# Patient Record
Sex: Male | Born: 1970 | Race: Black or African American | Hispanic: No | Smoking: Light tobacco smoker
Health system: Southern US, Community
[De-identification: ages and names within clinical notes are randomized; demographics above are authoritative.]

## PROBLEM LIST (undated history)

## (undated) ENCOUNTER — Ambulatory Visit: Admission: EM | Payer: 59 | Source: Home / Self Care

## (undated) DIAGNOSIS — E7800 Pure hypercholesterolemia, unspecified: Secondary | ICD-10-CM

---

## 2001-12-17 ENCOUNTER — Emergency Department (HOSPITAL_COMMUNITY): Admission: EM | Admit: 2001-12-17 | Discharge: 2001-12-17 | Payer: Self-pay | Admitting: Emergency Medicine

## 2006-08-31 ENCOUNTER — Emergency Department (HOSPITAL_COMMUNITY): Admission: EM | Admit: 2006-08-31 | Discharge: 2006-08-31 | Payer: Self-pay | Admitting: Emergency Medicine

## 2009-04-21 ENCOUNTER — Ambulatory Visit (HOSPITAL_BASED_OUTPATIENT_CLINIC_OR_DEPARTMENT_OTHER): Admission: RE | Admit: 2009-04-21 | Discharge: 2009-04-21 | Payer: Self-pay | Admitting: Family Medicine

## 2009-04-25 ENCOUNTER — Ambulatory Visit: Payer: Self-pay | Admitting: Internal Medicine

## 2009-10-06 ENCOUNTER — Encounter (INDEPENDENT_AMBULATORY_CARE_PROVIDER_SITE_OTHER): Payer: Self-pay | Admitting: Emergency Medicine

## 2009-10-06 ENCOUNTER — Observation Stay (HOSPITAL_COMMUNITY): Admission: EM | Admit: 2009-10-06 | Discharge: 2009-10-06 | Payer: Self-pay | Admitting: Emergency Medicine

## 2011-01-17 LAB — DIFFERENTIAL
Eosinophils Absolute: 0.2 10*3/uL (ref 0.0–0.7)
Eosinophils Relative: 3 % (ref 0–5)
Lymphs Abs: 3.1 10*3/uL (ref 0.7–4.0)
Monocytes Relative: 7 % (ref 3–12)

## 2011-01-17 LAB — LIPASE, BLOOD: Lipase: 25 U/L (ref 11–59)

## 2011-01-17 LAB — BASIC METABOLIC PANEL
BUN: 17 mg/dL (ref 6–23)
CO2: 28 mEq/L (ref 19–32)
Chloride: 101 mEq/L (ref 96–112)
Creatinine, Ser: 1.08 mg/dL (ref 0.4–1.5)

## 2011-01-17 LAB — HEPATIC FUNCTION PANEL
Alkaline Phosphatase: 44 U/L (ref 39–117)
Bilirubin, Direct: <0.1 mg/dL (ref 0.0–0.3)
Total Bilirubin: 0.6 mg/dL (ref 0.3–1.2)
Total Protein: 7.8 g/dL (ref 6.0–8.3)

## 2011-01-17 LAB — CBC
HCT: 46 % (ref 39.0–52.0)
MCV: 98.9 fL (ref 78.0–100.0)
Platelets: 143 10*3/uL — ABNORMAL LOW (ref 150–400)
WBC: 8.4 10*3/uL (ref 4.0–10.5)

## 2011-01-17 LAB — POCT CARDIAC MARKERS: Troponin i, poc: <0.05 ng/mL (ref 0.00–0.09)

## 2011-03-01 NOTE — Procedures (Signed)
Harry Gonzales, Harry Gonzales                   ACCOUNT NO.:  [id number]   MEDICAL RECORD NO.:  [id number]          PATIENT TYPE:  OUT   LOCATION:  SLEEP CENTER                 FACILITY:  [facility]   PHYSICIAN:  Clinton D. Maple Hudson, MD, FCCP, FACPDATE OF BIRTH:  1972-04-01   DATE OF STUDY:  04/21/2009                            NOCTURNAL POLYSOMNOGRAM   REFERRING PHYSICIAN:  Caryn Bee L. Little, M.D.   INDICATION FOR STUDY:  Hypersomnia with sleep apnea.  Epworth sleepiness  score 11/24, BMI 30, weight 209 pounds, height 70 inches, neck 17.5  inches.  Home medication charted and reviewed.   SLEEP ARCHITECTURE:  Total sleep time 344 minutes with sleep efficiency  93.9%.  Stage I 5.7%, stage II 76.5%, stage III absent, REM 17.9% of  total sleep time.  Sleep latency 1.5 minutes, REM latency 57.5 minutes,  awake after sleep onset 21 minutes, arousal index 19.  Bedtime  medication taken prior to arrival..   RESPIRATORY DATA:  Apnea/hypopnea index (AHI) 15.7 per hour.  A total of  90 events were scored including 71 obstructive apneas, 4 central apneas  and 15 hypopneas.  Events were seen in all sleep positions, but more  common while supine.  REM AHI 36.1 per hour.  An additional 8  respiratory effort related arousals were counted.  There were  insufficient early events to permit CPAP titration by split protocol on  the study night.   OXYGEN DATA:  Moderate to occasionally loud snoring with oxygen  desaturation to a nadir of 87%.  Mean oxygen saturation through the  study was 95.6% on room air.   CARDIAC DATA:  Normal sinus rhythm.   MOVEMENT/PARASOMNIA:  No significant movement disturbance.  Bathroom x1.   IMPRESSION/RECOMMENDATIONS:  1. Moderate obstructive sleep apnea/hypopnea syndrome, AHI 15.7 per      hour with events seen in all sleep positions.  REM AHI 36.1.      Moderate to loud snoring with oxygen desaturation to a nadir of      87%.  2. There were insufficient early events to permit CPAP  titration by      split study protocol on this study night.  If CPAP is considered a      treatment option, consider return for CPAP titration or evaluate      for alternative management as appropriate.      Clinton D. Maple Hudson, MD, [facility], FACP  Diplomate, Biomedical engineer of Sleep Medicine  Electronically Signed    CDY/MEDQ  D:  04/25/2009 11:47:26  T:  04/25/2009 23:18:50  Job:  [id number]

## 2011-08-27 ENCOUNTER — Encounter: Payer: Self-pay | Admitting: *Deleted

## 2011-08-27 ENCOUNTER — Emergency Department (HOSPITAL_COMMUNITY)
Admission: EM | Admit: 2011-08-27 | Discharge: 2011-08-27 | Disposition: A | Discharge status: Home / Self Care | Payer: Self-pay | Attending: Emergency Medicine | Admitting: Emergency Medicine

## 2011-08-27 DIAGNOSIS — J069 Acute upper respiratory infection, unspecified: Secondary | ICD-10-CM | POA: Insufficient documentation

## 2011-08-27 DIAGNOSIS — F17200 Nicotine dependence, unspecified, uncomplicated: Secondary | ICD-10-CM | POA: Insufficient documentation

## 2011-08-27 MED ORDER — IBUPROFEN 800 MG PO TABS
800.0000 mg | ORAL_TABLET | Freq: Once | ORAL | Status: DC
  Filled 2011-08-27: qty 1

## 2011-08-27 MED ORDER — IBUPROFEN 800 MG PO TABS: 800.0000 mg | ORAL_TABLET | Freq: Three times a day (TID) | ORAL | Status: AC

## 2011-08-27 NOTE — ED Notes (Signed)
Generalized body aches for several days, congestion and cough

## 2011-08-27 NOTE — ED Provider Notes (Signed)
History     CSN: [id number] Arrival date & time: 08/27/2011  5:09 PM   First MD Initiated Contact with Patient 08/27/11 1849      Chief Complaint  Patient presents with  . Generalized Body Aches    (Consider location/radiation/quality/duration/timing/severity/associated sxs/prior treatment) HPI  Patient presents to emergency department complaining of gradual onset 2 day history of nasal congestion mild cough but today states gradual onset total body aches which is his chief complaint. Patient denies known fevers. Patient has not taken any medication prior to arrival. Patient denies headache, stiff neck, chest pain, shortness of breath, wheezing, abdominal pain, nausea, vomiting, diarrhea. Patient states multiple coworkers have been sick with similar symptoms. Denies aggravating or alleviating factors. Symptoms were gradual onset, persistent, and unchanging.  History reviewed. No pertinent past medical history.  History reviewed. No pertinent past surgical history.  No family history on file.  History  Substance Use Topics  . Smoking status: Current Everyday Smoker  . Smokeless tobacco: Not on file  . Alcohol Use: Yes      Review of Systems  All other systems reviewed and are negative.    Allergies  Review of patient's allergies indicates not on file.  Home Medications  No current outpatient prescriptions on file.  BP 142/88  Pulse 84  Temp(Src) 98.7 F (37.1 C) (Oral)  Resp 20  SpO2 100%  Physical Exam  Vitals reviewed. Constitutional: He is oriented to person, place, and time. He appears well-developed and well-nourished. No distress.  HENT:  Head: Normocephalic and atraumatic.  Right Ear: External ear normal.  Left Ear: External ear normal.  Nose: Nose normal.  Mouth/Throat: No oropharyngeal exudate.       Mild erythema of posterior pharynx and tonsils no tonsillar exudate or enlargement. Patent airway. Swallowing secretions well  Eyes: Conjunctivae and  EOM are normal. Pupils are equal, round, and reactive to light.  Neck: Normal range of motion. Neck supple.  Cardiovascular: Normal rate, regular rhythm and normal heart sounds.  Exam reveals no gallop and no friction rub.   No murmur heard. Pulmonary/Chest: Effort normal and breath sounds normal. No respiratory distress. He has no wheezes. He has no rales. He exhibits no tenderness.  Abdominal: Soft. He exhibits no distension and no mass. There is no tenderness. There is no rebound and no guarding.  Lymphadenopathy:    He has no cervical adenopathy.  Neurological: He is alert and oriented to person, place, and time. He has normal reflexes.  Skin: Skin is warm and dry. No rash noted. He is not diaphoretic.  Psychiatric: He has a normal mood and affect.    ED Course  Procedures (including critical care time)  By mouth ibuprofen  Labs Reviewed - No data to display No results found.   1. Upper respiratory infection       MDM  Patient with upper respiratory type symptoms with normal lung exam and afebrile. Patient is nontoxic-appearing. Question viral illness.        Jenness Corner, [place] 08/28/11 [phone]

## 2011-08-28 NOTE — ED Provider Notes (Signed)
Medical screening examination/treatment/procedure(s) were performed by non-physician practitioner and as supervising physician I was immediately available for consultation/collaboration.  Hurman Horn, MD 08/28/11 1245

## 2012-02-07 ENCOUNTER — Emergency Department (HOSPITAL_COMMUNITY): Payer: Self-pay

## 2012-02-07 ENCOUNTER — Observation Stay (HOSPITAL_COMMUNITY)
Admission: EM | Admit: 2012-02-07 | Discharge: 2012-02-08 | Disposition: A | Discharge status: Home / Self Care | Payer: Self-pay | Attending: Emergency Medicine | Admitting: Emergency Medicine

## 2012-02-07 ENCOUNTER — Other Ambulatory Visit: Payer: Self-pay

## 2012-02-07 ENCOUNTER — Encounter (HOSPITAL_COMMUNITY): Payer: Self-pay | Admitting: *Deleted

## 2012-02-07 DIAGNOSIS — R61 Generalized hyperhidrosis: Secondary | ICD-10-CM | POA: Insufficient documentation

## 2012-02-07 DIAGNOSIS — E7800 Pure hypercholesterolemia, unspecified: Secondary | ICD-10-CM | POA: Insufficient documentation

## 2012-02-07 DIAGNOSIS — R079 Chest pain, unspecified: Principal | ICD-10-CM | POA: Insufficient documentation

## 2012-02-07 DIAGNOSIS — R209 Unspecified disturbances of skin sensation: Secondary | ICD-10-CM | POA: Insufficient documentation

## 2012-02-07 DIAGNOSIS — F17200 Nicotine dependence, unspecified, uncomplicated: Secondary | ICD-10-CM | POA: Insufficient documentation

## 2012-02-07 DIAGNOSIS — R42 Dizziness and giddiness: Secondary | ICD-10-CM | POA: Insufficient documentation

## 2012-02-07 DIAGNOSIS — Z8249 Family history of ischemic heart disease and other diseases of the circulatory system: Secondary | ICD-10-CM | POA: Insufficient documentation

## 2012-02-07 DIAGNOSIS — R0602 Shortness of breath: Secondary | ICD-10-CM | POA: Insufficient documentation

## 2012-02-07 HISTORY — DX: Pure hypercholesterolemia, unspecified: E78.00

## 2012-02-07 LAB — BASIC METABOLIC PANEL
BUN: 14 mg/dL (ref 6–23)
Chloride: 99 mEq/L (ref 96–112)
GFR calc Af Amer: >90 mL/min (ref >90)
Glucose, Bld: 88 mg/dL (ref 70–99)
Potassium: 3.7 mEq/L (ref 3.5–5.1)
Sodium: 137 mEq/L (ref 135–145)

## 2012-02-07 LAB — CARDIAC PANEL(CRET KIN+CKTOT+MB+TROPI)
CK, MB: 5.7 ng/mL — ABNORMAL HIGH (ref 0.3–4.0)
Total CK: 443 U/L — ABNORMAL HIGH (ref 7–232)
Troponin I: <0.3 ng/mL (ref <0.30)
Troponin I: <0.3 ng/mL (ref <0.30)

## 2012-02-07 LAB — DIFFERENTIAL
Lymphs Abs: 4.3 10*3/uL — ABNORMAL HIGH (ref 0.7–4.0)
Monocytes Relative: 6 % (ref 3–12)
Neutro Abs: 5.3 10*3/uL (ref 1.7–7.7)
Neutrophils Relative %: 50 % (ref 43–77)

## 2012-02-07 LAB — CBC
Hemoglobin: 16.4 g/dL (ref 13.0–17.0)
RBC: 4.82 MIL/uL (ref 4.22–5.81)
WBC: 10.6 10*3/uL — ABNORMAL HIGH (ref 4.0–10.5)

## 2012-02-07 LAB — POCT I-STAT TROPONIN I: Troponin i, poc: 0 ng/mL (ref 0.00–0.08)

## 2012-02-07 MED ORDER — ZOLPIDEM TARTRATE 5 MG PO TABS
5.0000 mg | ORAL_TABLET | Freq: Every evening | ORAL | Status: DC | PRN
  Administered 2012-02-07: 5 mg via ORAL
  Filled 2012-02-07: qty 1

## 2012-02-07 MED ORDER — ASPIRIN 81 MG PO CHEW
162.0000 mg | CHEWABLE_TABLET | Freq: Once | ORAL | Status: AC
  Administered 2012-02-07: 162 mg via ORAL
  Filled 2012-02-07: qty 2

## 2012-02-07 NOTE — ED Notes (Signed)
PT BMI IS 30.6

## 2012-02-07 NOTE — ED Provider Notes (Signed)
History     CSN: [id number]  Arrival date & time 02/07/12  1645   First MD Initiated Contact with Patient 02/07/12 1653      Chief Complaint  Patient presents with  . Chest Pain  . Shortness of Breath    (Consider location/radiation/quality/duration/timing/severity/associated sxs/prior treatment) HPI  Patient presents to emergency department complaining of chest pain, left arm numbness tingling, shortness of breath, diaphoresis. Patient states that he was at work this afternoon began to have gradual onset left-sided chest discomfort with radiation of pain into his left arm with numbness and tingling. Patient states that he took 2 baby aspirin without relief of symptoms. Patient states he gathered his things to leave when he noticed acute onset dizziness and felt like his heart was racing as well as felt short of breath and sweaty. Patient states he was able to drive himself to the ER but by time of arrival felt increasingly short of breath, sweaty, and like his heart was racing with ongoing chest discomfort. Patient states that since lying down in triage and getting back into stretcher, the chest pain has resolved. Patient states he had similar chest discomfort last week and took 2 baby aspirin with relief of symptoms with in minutes. Patient states he last remembers similar symptoms approximately a year ago but does not remember other details. Patient is followed by PCP, Dr. Thurmond Butts but no other physicians. Patient states he has history of high cholesterol for which takes medication. He states he has no other known medical problems takes no other medicines on a regular basis. Patient states he has family history of hypertension but denies early family history of heart disease or heart attack. Patient smokes tobacco. He denies aggravating or alleviating factors.  Past Medical History  Diagnosis Date  . Hypercholesterolemia     History reviewed. No pertinent past surgical history.  No family  history on file.  History  Substance Use Topics  . Smoking status: Current Everyday Smoker  . Smokeless tobacco: Not on file  . Alcohol Use: Yes      Review of Systems  All other systems reviewed and are negative.    Allergies  Codeine  Home Medications   Current Outpatient Rx  Name Route Sig Dispense Refill  . ASPIRIN EC 81 MG PO TBEC Oral Take 162 mg by mouth once.      BP 154/99  Pulse 114  Temp 97.3 F (36.3 C)  Resp 15  SpO2 98%  Physical Exam  Nursing note and vitals reviewed. Constitutional: He is oriented to person, place, and time. He appears well-developed and well-nourished. No distress.  HENT:  Head: Normocephalic and atraumatic.  Eyes: Conjunctivae are normal.  Neck: Normal range of motion. Neck supple.  Cardiovascular: Normal rate, regular rhythm, normal heart sounds and intact distal pulses.  Exam reveals no gallop and no friction rub.   No murmur heard. Pulmonary/Chest: Effort normal and breath sounds normal. No respiratory distress. He has no wheezes. He has no rales. He exhibits no tenderness.  Abdominal: Soft. Bowel sounds are normal. He exhibits no distension and no mass. There is no tenderness. There is no rebound and no guarding.  Musculoskeletal: Normal range of motion. He exhibits no edema and no tenderness.  Neurological: He is alert and oriented to person, place, and time.  Skin: Skin is warm and dry. No rash noted. He is not diaphoretic. No erythema.  Psychiatric: He has a normal mood and affect.    ED Course  Procedures (  including critical care time)  Two addition baby ASA.   Family hx of CAD, stroke and HTN.  6:00 PM I discussed the case with Dr. Juleen China, who agrees the patient is appropriate for chest pain rule out in our CDU. Patient states that he remains chest pain free. Patient voices his understanding of the CDU chest pain protocol and is agreeable to plan. Sign out given to Trixie Dredge, physician assistant.   Date:  02/07/2012  Rate: 95  Rhythm: normal sinus rhythm  QRS Axis: normal  Intervals: normal  ST/T Wave abnormalities: normal  Conduction Disutrbances: none  Narrative Interpretation: non provocative EKG  Old EKG Reviewed: No significant changes noted    Labs Reviewed  CBC - Abnormal; Notable for the following:    WBC 10.6 (*)    All other components within normal limits  DIFFERENTIAL - Abnormal; Notable for the following:    Lymphs Abs 4.3 (*)    All other components within normal limits  POCT I-STAT TROPONIN I  BASIC METABOLIC PANEL   Dg Chest 2 View  02/07/2012  *RADIOLOGY REPORT*  Clinical Data: Chest pain  CHEST - 2 VIEW  Comparison: 10/06/2009  Findings: The heart size and mediastinal contours are within normal limits.  Both lungs are clear.  The visualized skeletal structures are unremarkable.  IMPRESSION: Negative exam.  Original Report Authenticated By: Rosealee Albee, M.D.     No diagnosis found.    MDM  CDU CP protocol with sign out to [facility] who will continue to follow and dispo pending further diagnostics.         Jenness Corner, [place] 02/07/12 1801

## 2012-02-07 NOTE — ED Notes (Signed)
Pt is here with onset one hour ago of chest pain and had left arm numbness and became sob and diaphoresis.  Pt reported some dizziness with heart racing.  Pt denies drug use

## 2012-02-07 NOTE — ED Provider Notes (Signed)
Patient is in CDU under observation, chest pain protocol.  Patient with episode of chest pain, left arm tingling, dizziness while at work.  States that he has had increased stress and anxiety recently preparing for upcoming trip to [place].  PMH significant for hyperlidemia, patient is a smoker.  Family cardiac hx consists of father who had MI at 40 years old.  No known early CAD in family.  Patient's PCP is a family member (not present).  Pt currently feeling slightly lightheaded, no CP, SOB.  Plan is for coronary CT in the morning.  Discussed all results thus far with patient and family member.  Patient verbalizes understanding and agrees with plan.    10:58 PM Patient continues in CDU chest pain protocol.  Patient continues to be pain and symptom free.  He requests ambien at this time for sleep.  On exam, pt is A&Ox4, NAD, RRR, no m/r/g, CTAB, abd soft, NT, extremities without edema, distal pulses intact and equal bilaterally.    11:52 PM Patient signed out to Dr Hyacinth Meeker who assumes care of the patient overnight.  Pt to have coronary CT in the morning.    Harry Gonzales, [place] 02/07/12 2352

## 2012-02-07 NOTE — ED Notes (Signed)
Gave old and new ECG to Dr. Manus Gunning after I performed. 4:52pm JG.

## 2012-02-08 ENCOUNTER — Observation Stay (HOSPITAL_COMMUNITY): Admit: 2012-02-08 | Discharge: 2012-02-08 | Disposition: A | Discharge status: Home / Self Care | Payer: Self-pay

## 2012-02-08 ENCOUNTER — Encounter (HOSPITAL_COMMUNITY): Payer: Self-pay | Admitting: *Deleted

## 2012-02-08 MED ORDER — METOPROLOL TARTRATE 25 MG PO TABS
100.0000 mg | ORAL_TABLET | Freq: Once | ORAL | Status: AC
  Administered 2012-02-08: 100 mg via ORAL
  Filled 2012-02-08: qty 4

## 2012-02-08 MED ORDER — NITROGLYCERIN 0.4 MG SL SUBL
SUBLINGUAL_TABLET | SUBLINGUAL | Status: AC
  Filled 2012-02-08: qty 25

## 2012-02-08 MED ORDER — IOHEXOL 350 MG/ML SOLN
80.0000 mL | Freq: Once | INTRAVENOUS | Status: AC | PRN
  Administered 2012-02-08: 80 mL via INTRAVENOUS

## 2012-02-08 MED ORDER — METOPROLOL TARTRATE 1 MG/ML IV SOLN
5.0000 mg | Freq: Once | INTRAVENOUS | Status: AC
  Administered 2012-02-08: 5 mg via INTRAVENOUS

## 2012-02-08 MED ORDER — NITROGLYCERIN 0.4 MG SL SUBL
0.4000 mg | SUBLINGUAL_TABLET | Freq: Once | SUBLINGUAL | Status: AC
  Administered 2012-02-08: 0.4 mg via SUBLINGUAL

## 2012-02-08 MED ORDER — METOPROLOL TARTRATE 25 MG PO TABS
50.0000 mg | ORAL_TABLET | Freq: Once | ORAL | Status: AC
  Administered 2012-02-08: 50 mg via ORAL
  Filled 2012-02-08: qty 2

## 2012-02-08 MED ORDER — METOPROLOL TARTRATE 1 MG/ML IV SOLN
INTRAVENOUS | Status: AC
  Filled 2012-02-08: qty 10

## 2012-02-08 MED ORDER — METOPROLOL TARTRATE 25 MG PO TABS
ORAL_TABLET | ORAL | Status: AC
  Filled 2012-02-08: qty 4

## 2012-02-08 NOTE — ED Provider Notes (Signed)
7:15 AM Patient with a hx sig for hyperlipidemia and smoking was placed in CDU on chest pain protocol by Dr. Juleen China. Patient care resumed from Dr. Hyacinth Meeker .  Patient is here awaiting CT coronary arteries. While in obeservation over night the pt slept well and had no complaints, per nursing staff. Patient re-evaluated and is resting comfortable, VSS, with no new complaints or concerns at this time.  Patient denies any chest pain, SOB, or dizziness.   Plan per previous provider is to discharge patient home if CT coronary is normal. On exam: hemodynamically stable, NAD, heart w/ RRR, lungs CTAB, Chest & abd non-tender, no peripheral edema or calf tenderness.   11:25  Result of the CT coronary arteries are as follows: MPRESSION:  1. No atherosclerotic coronary artery disease.  2. Total coronary artery calcium score is zero, which is zero  percentile for patient's matched age and gender.  Patient continues to be asymptomatic.  Will discharge patient home.  Patient instructed to follow up with PCP.  Patient verbalizes understanding and is in agreement with the plan.   Pascal Lux [place], PA-C 02/08/12 [phone]

## 2012-02-08 NOTE — Discharge Instructions (Signed)
Read instructions below for reasons to return to the Emergency Department. It is recommended that your follow up with your Primary Care Doctor in regards to today's visit.   Chest Pain (Nonspecific)  HOME CARE INSTRUCTIONS  For the next few days, avoid physical activities that bring on chest pain. Continue physical activities as directed.  Do not smoke cigarettes or drink alcohol until your symptoms are gone.  Only take over-the-counter or prescription medicine for pain, discomfort, or fever as directed by your caregiver.  Follow your caregiver's suggestions for further testing if your chest pain does not go away.  Keep any follow-up appointments you made. If you do not go to an appointment, you could develop lasting (chronic) problems with pain. If there is any problem keeping an appointment, you must call to reschedule.  SEEK MEDICAL CARE IF:  You think you are having problems from the medicine you are taking. Read your medicine instructions carefully.  Your chest pain does not go away, even after treatment.  You develop a rash with blisters on your chest.  SEEK IMMEDIATE MEDICAL CARE IF:  You have increased chest pain or pain that spreads to your arm, neck, jaw, back, or belly (abdomen).  You develop shortness of breath, an increasing cough, or you are coughing up blood.  You have severe back or abdominal pain, feel sick to your stomach (nauseous) or throw up (vomit).  You develop severe weakness, fainting, or chills.  You have an oral temperature above 102 F (38.9 C), not controlled by medicine.   THIS IS AN EMERGENCY. Do not wait to see if the pain will go away. Get medical help at once. Call your local emergency services (911 in [place] Do not drive yourself to the hospital.   RESOURCE GUIDE  Dental Problems  Patients with Medicaid: [facility]                     [facility] [address]                                           [address] Phone:  [phone]                                                  Phone:  [phone]  If unable to pay or uninsured, contact:  [facility] or [facility] to become qualified for the adult dental clinic.  Chronic Pain Problems Contact [facility]  [phone] Patients need to be referred by their primary care doctor.  Insufficient Money for Medicine Contact [employer]:  call "211" or [facility] 271-5999.  No Primary Care Doctor Call [facility]  832-8000 Other agencies that provide inexpensive medical care    [facility]  832-8035    [facility]  832-7272    [facility]  [phone]    Women[facility] Clinic  832-4777    [facility]  [phone]    [facility]  [phone]  Psychological Services [facility]  [phone] [facility]  [phone] [facility]   800 853-5163 (emergency services 641-4993)  Substance Abuse Resources [facility]    336-882-2125 [facility] 336-784-9470 [facility] 336-285-9073 [facility] 336-845-3988 Residential & Outpatient Substance Abuse Program  800-659-3381  Abuse/Neglect [place] Child Abuse Hotline (336) 641-3795 [place] Child Abuse Hotline 800-378-5315 (After Hours)  Emergency Shelter [facility] (336) 271-5985  [facility] (336) 275-9566 [facility] (704) 372-4663  MRSA Hotline #:   832-7006    [place] Resources  [facility] [place]     [employer]                          [facility] [address] [place]                       [address]      [address]  [place]                                                [place]                            [place] Phone:  [phone]                                   Phone:  [phone]                  Phone:  [phone]  [facility] Phone:  [phone]  [place] Child Abuse Hotline (336) 342-1394 (336) 342-3537 (After Hours)   

## 2012-02-08 NOTE — ED Notes (Signed)
Change of shift - care signed out to Dr. Lilia Pro, MD 02/08/12 [phone]

## 2012-02-08 NOTE — ED Provider Notes (Signed)
  Physical Exam  BP 104/69  Pulse 63  Temp(Src) 98.1 F (36.7 C) (Oral)  Resp 14  Ht 5\' 10"  (1.778 m)  Wt 213 lb (96.616 kg)  BMI 30.56 kg/m2  SpO2 96%  Physical Exam  ED Course  Procedures  MDM Change of shift, patient accepted into care from Dr. Juleen China, patient has been chest pain free all night long, on repeat evaluation this morning there is clear heart and lung sounds without murmurs or respiratory distress. Vital signs are normal and EKG has not changed since prior EKG. Scheduled for CT of his coronary arteries this morning.      Vida Roller, MD 02/08/12 [phone]

## 2012-02-08 NOTE — ED Provider Notes (Signed)
Medical screening examination/treatment/procedure(s) were performed by non-physician practitioner and as supervising physician I was immediately available for consultation/collaboration.  Raeford Razor, MD 02/08/12 [phone]

## 2012-02-11 NOTE — ED Provider Notes (Signed)
Medical screening examination/treatment/procedure(s) were performed by non-physician practitioner and as supervising physician I was immediately available for consultation/collaboration.  Simpson Paulos R. Nyquan Selbe, MD 02/11/12 0735 

## 2012-02-12 NOTE — ED Provider Notes (Signed)
Medical screening examination/treatment/procedure(s) were performed by non-physician practitioner and as supervising physician I was immediately available for consultation/collaboration.  Raeford Razor, MD 02/12/12 [phone]

## 2013-06-07 IMAGING — CT CT HEART MORP W/ CTA COR W/ SCORE W/ CA W/CM &/OR W/O CM
2 series · 16 of 20 positions shown, 18 images · IV contrast (omnipaque)
Comparison: None

INDICATION: 41-year-old male with chest pain, chest pressure, and
short of breath.

CT ANGIOGRAPHY OF THE HEART, CORONARY ARTERY, STRUCTURE, AND
MORPHOLOGY
CONTRAST: 80mL OMNIPAQUE IOHEXOL 350 MG/ML SOLN
TECHNIQUE: CT angiography of the coronary vessels was performed on
a 256 channel system using prospective ECG gating.  A scout and
noncontrast exam (for calcium scoring) were performed.  Circulation
time was measured using a test bolus.  Coronary CTA was performed
with sub mm slice collimation during portions of the cardiac cycle
after prior injection of iodinated contrast.  Imaging post
processing was performed on an independent workstation creating
multiplanar and 3-D images, and quantitative analysis of the heart
and coronary arteries.  Note that this exam targets the heart and
the chest was not imaged in its entirety.
PREMEDICATION:
Lopressor 150 mg, P.O.
Lopressor 5 mg, IV
Nitroglycerin 400 mcg, sublingual.

[Series 2: calcium score · axial · 0.49mm/px · z∈[-265,-175]mm · 8 of 48 slices shown, 10 images]
[im 6/48  vessel]
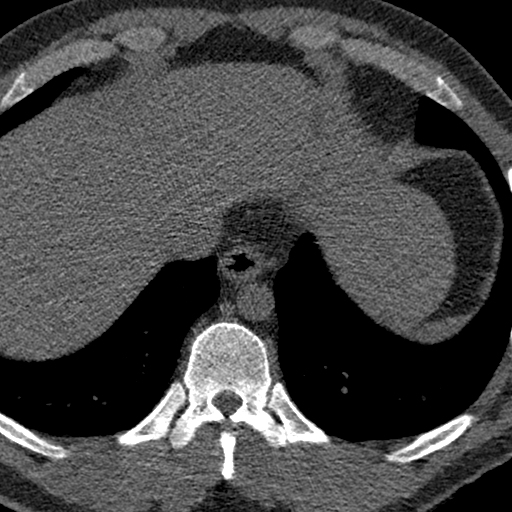
[im 6/48  lung]
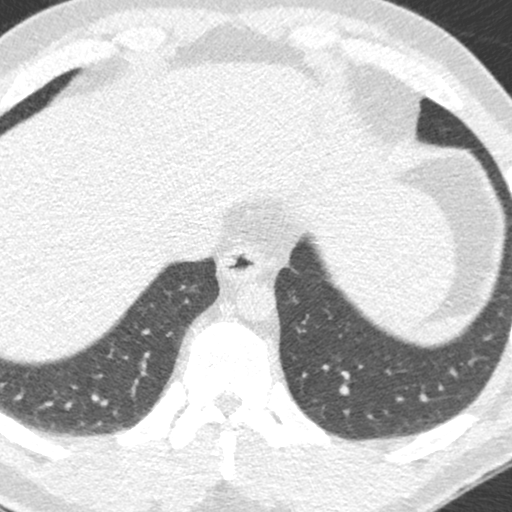
[im 11/48  vessel]
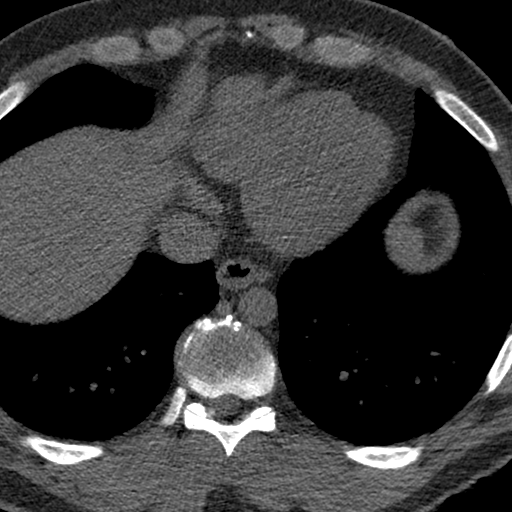
[im 16/48  vessel]
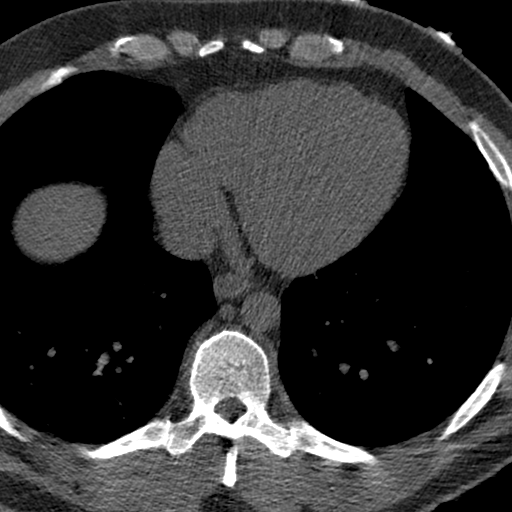
[im 21/48  vessel]
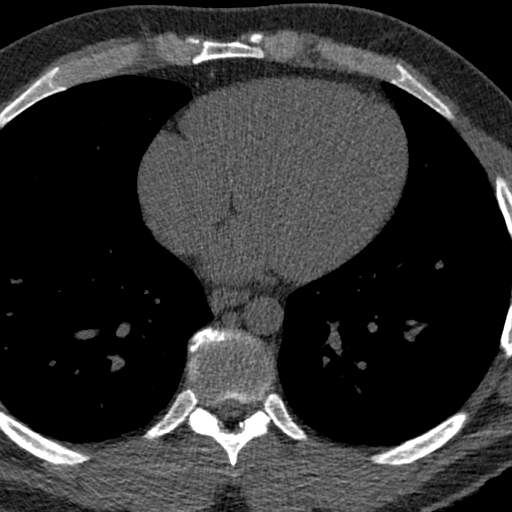
[im 27/48  vessel]
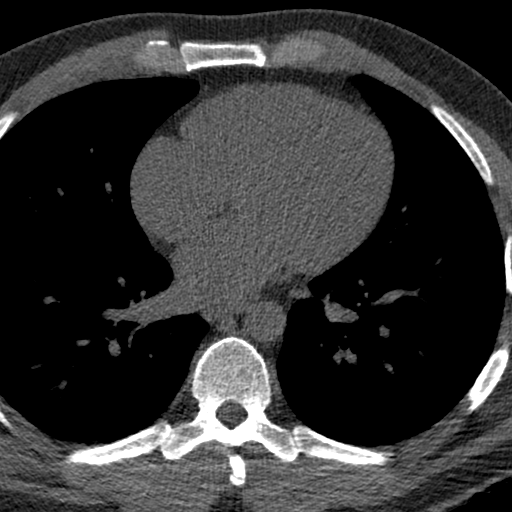
[im 27/48  lung]
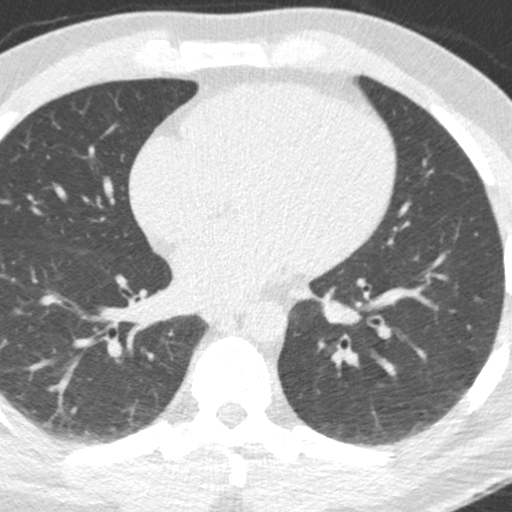
[im 32/48  vessel]
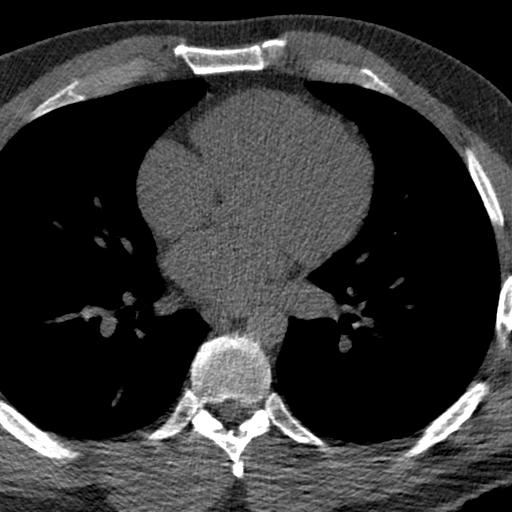
[im 37/48  vessel]
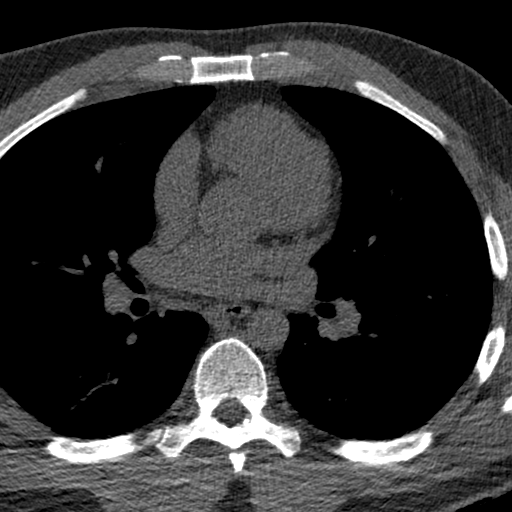
[im 42/48  vessel]
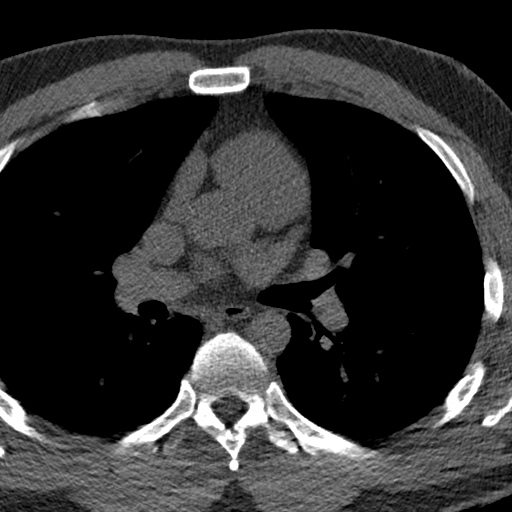

[Series 5: soft w/o · axial · non-contrast · 0.81mm/px · z∈[-265,-175]mm · 8 of 48 slices shown]
[im 6/48  vessel]
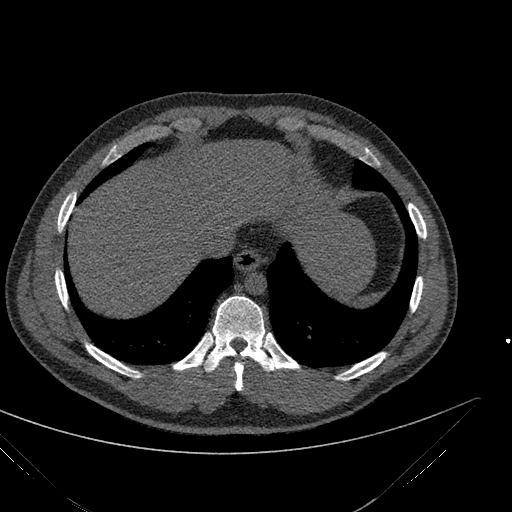
[im 11/48  vessel]
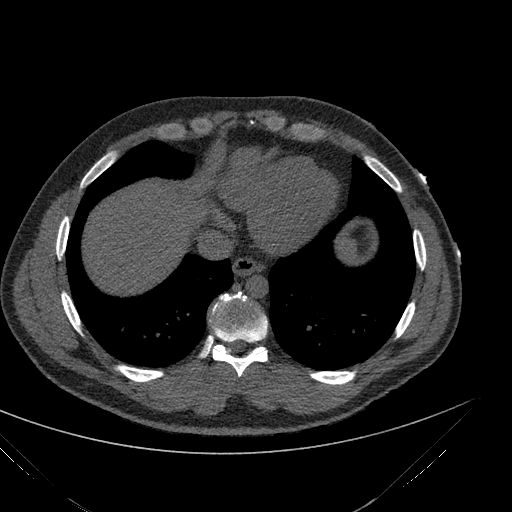
[im 16/48  vessel]
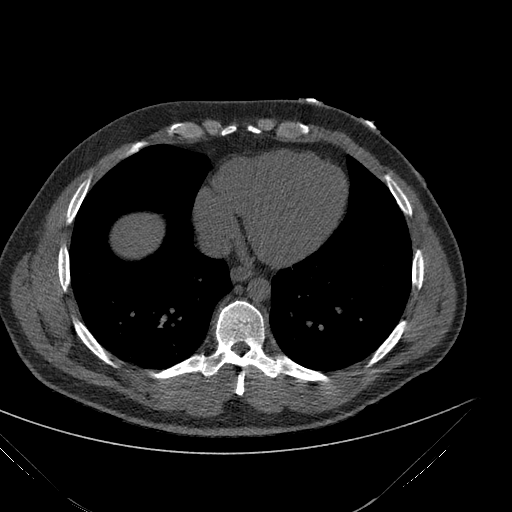
[im 21/48  vessel]
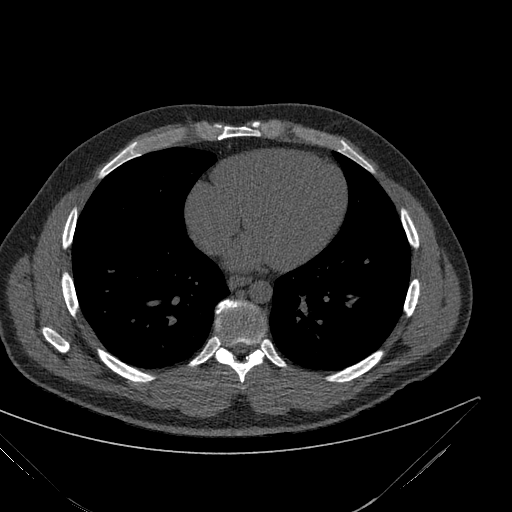
[im 27/48  vessel]
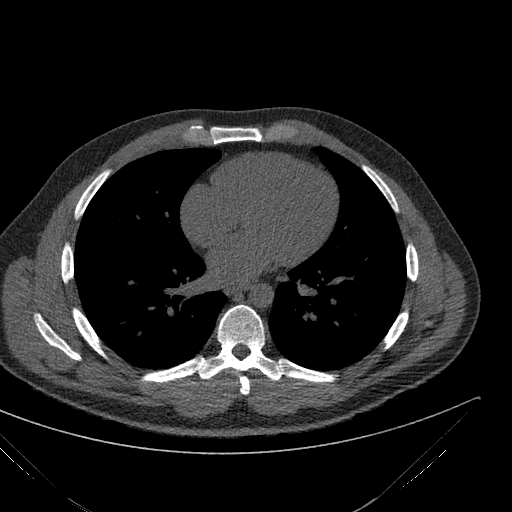
[im 32/48  vessel]
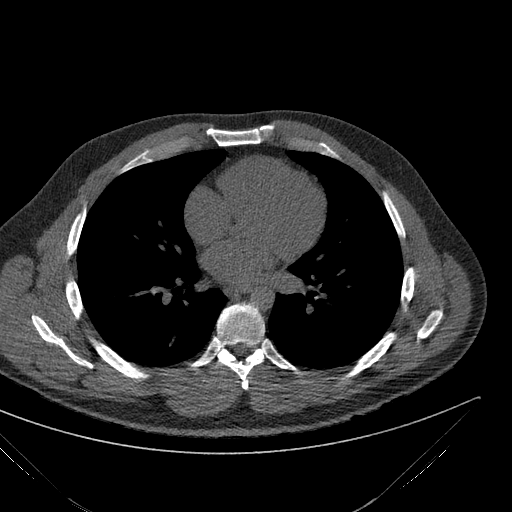
[im 37/48  vessel]
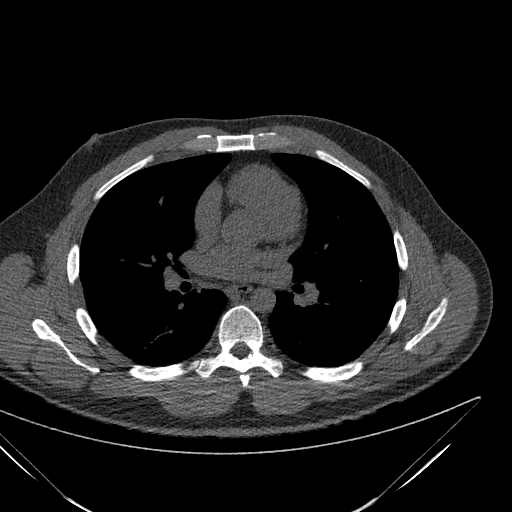
[im 42/48  vessel]
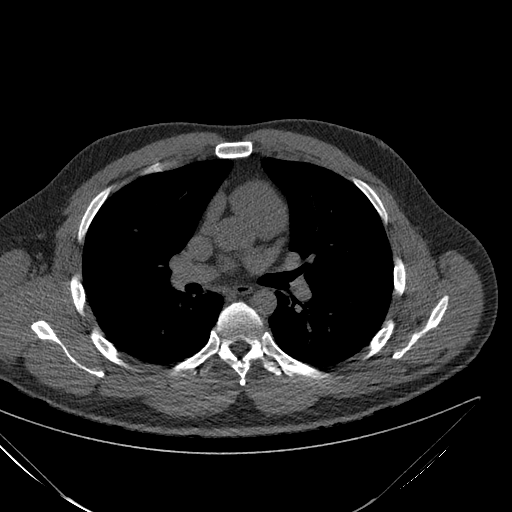

[16 of 20 positions shown; findings below may reference images not displayed]

FINDINGS: Technical quality:  Excellent

Heart rate:  62

CORONARY ARTERIES:
Left main coronary artery:  Normal caliber vessel with no
atherosclerotic disease.
Left anterior descending:  Normal caliber vessel with no
atherosclerotic disease. A short segment of the proximal LAD
courses through the myocardium  distal to the first diagonal
branch.  There are three diagonal branches without atherosclerotic
disease.

Ramus intermedius:  Moderate sized ramus branch without
atherosclerotic disease.
Left circumflex:  Normal sized vessel without atherosclerotic
disease.
Right coronary artery:  Small vessel which has an early
trifurcation.  No evidence of atherosclerotic disease.
Posterior descending artery:  Extends from the circumflex artery.
No atherosclerotic disease.
Dominance:  Left

CORONARY CALCIUM:
Total Agatston Score:  Zero
[HOSPITAL] percentile:  Zero

CARDIAC MEASUREMENTS:

AORTA AND PULMONARY MEASUREMENTS:
Aortic root (21 - 40 mm):
            24 mm  at the annulus
            30 mm  at the sinuses of Valsalva
            23 mm  at the sinotubular junction
Ascending aorta ( <  40 mm):  24 mm
Descending aorta ( <  40 mm):  19 mm
Main pulmonary artery:  ( <  30 mm):  24 mm

EXTRACARDIAC FINDINGS:
No significant extracardiac findings.
IMPRESSION: 1.  No atherosclerotic coronary artery disease.
2. Total coronary artery calcium score is zero, which is zero
percentile for patient's matched age and gender.
3.  Left coronary artery dominance.

Report was called to [REDACTED] mid level Abou Salma  at [phone] at

## 2013-09-22 ENCOUNTER — Emergency Department (HOSPITAL_COMMUNITY)
Admission: EM | Admit: 2013-09-22 | Discharge: 2013-09-22 | Disposition: A | Discharge status: Home / Self Care | Payer: PRIVATE HEALTH INSURANCE | Source: Home / Self Care | Attending: Family Medicine | Admitting: Family Medicine

## 2013-09-22 ENCOUNTER — Encounter (HOSPITAL_COMMUNITY): Payer: Self-pay | Admitting: Emergency Medicine

## 2013-09-22 DIAGNOSIS — T148XXA Other injury of unspecified body region, initial encounter: Secondary | ICD-10-CM

## 2013-09-22 DIAGNOSIS — R0789 Other chest pain: Secondary | ICD-10-CM

## 2013-09-22 MED ORDER — CYCLOBENZAPRINE HCL 5 MG PO TABS: 5.0000 mg | ORAL_TABLET | Freq: Three times a day (TID) | ORAL | Status: DC | PRN

## 2013-09-22 NOTE — ED Provider Notes (Signed)
Medical screening examination/treatment/procedure(s) were performed by resident physician or non-physician practitioner and as supervising physician I was immediately available for consultation/collaboration.   Barkley Bruns MD.   Linna Hoff, MD 09/22/13 [phone]

## 2013-09-22 NOTE — ED Notes (Signed)
Pt c/o back pain x 1 week ago that radiates to his left arm and chest. Denies hx of heart problems. Does not recall any previous injury. Was doing some heavy lifting about a month ago. Pt reports pain has progressively gotten worse and has had some numbness in left arm. Pt is alert and oriented and in no acute distress.

## 2013-09-22 NOTE — ED Provider Notes (Signed)
CSN: [id number]     Arrival date & time 09/22/13  1308 History   First MD Initiated Contact with Patient 09/22/13 1045     Chief Complaint  Patient presents with  . Back Pain   (Consider location/radiation/quality/duration/timing/severity/associated sxs/prior Treatment) HPI Comments: Denies injury. Pain in L lateral neck/shoulder area, sometimes radiates down arm. Last night at about 8pm it also radiated over to L chest. This lasted 2 hours and then resolved on its own.  Describes as sharp. Denies any other associated sx (no nausea, vomiting, diaphoresis, anxiety, sob, chest pressure).   Patient is a 42 y.o. male presenting with shoulder pain. The history is provided by the patient.  Shoulder Pain This is a new problem. Episode onset: a week ago. The problem occurs constantly. The problem has not changed since onset.Associated symptoms include chest pain. Pertinent negatives include no shortness of breath. Exacerbated by: movement. Nothing relieves the symptoms. Treatments tried: tried ibuprofen for the first time today and feels it has helped some. The treatment provided moderate relief.    Past Medical History  Diagnosis Date  . Hypercholesterolemia    History reviewed. No pertinent past surgical history. History reviewed. No pertinent family history. History  Substance Use Topics  . Smoking status: Current Every Day Smoker  . Smokeless tobacco: Not on file  . Alcohol Use: Yes    Review of Systems  Constitutional: Negative for fever, chills and diaphoresis.  Respiratory: Negative for chest tightness and shortness of breath.   Cardiovascular: Positive for chest pain.  Gastrointestinal: Negative for nausea and vomiting.  Musculoskeletal: Positive for neck pain.    Allergies  Codeine  Home Medications   Current Outpatient Rx  Name  Route  Sig  Dispense  Refill  . aspirin EC 81 MG tablet   Oral   Take 162 mg by mouth once.         . cyclobenzaprine (FLEXERIL) 5 MG  tablet   Oral   Take 1 tablet (5 mg total) by mouth 3 (three) times daily as needed for muscle spasms.   30 tablet   0    BP 134/89  Pulse 78  Temp(Src) 98.1 F (36.7 C) (Oral)  Resp 20  SpO2 99% Physical Exam  Constitutional: He appears well-developed and well-nourished. No distress.  Cardiovascular: Normal rate and regular rhythm.   Pulmonary/Chest: Effort normal and breath sounds normal. He exhibits no tenderness.  Musculoskeletal:       Left shoulder: He exhibits tenderness. He exhibits normal range of motion and no bony tenderness.       Arms: Skin: Skin is warm and dry. No rash noted. No erythema.    ED Course  Procedures (including critical care time) Labs Review Labs Reviewed - No data to display Imaging Review No results found.  EKG Interpretation    Date/Time:    Ventricular Rate:    PR Interval:    QRS Duration:   QT Interval:    QTC Calculation:   R Axis:     Text Interpretation:              MDM   1. Muscle strain   2. Musculoskeletal chest pain   ekg shows nsr. Most likely msk pain related to strained muscle. rx flexeril 5mg  TID prn #30. Pt to continue using ibuprofen at home     Cathlyn Parsons, NP 09/22/13 1100

## 2014-04-04 ENCOUNTER — Emergency Department (INDEPENDENT_AMBULATORY_CARE_PROVIDER_SITE_OTHER)
Admission: EM | Admit: 2014-04-04 | Discharge: 2014-04-04 | Disposition: A | Discharge status: Home / Self Care | Payer: No Typology Code available for payment source | Source: Home / Self Care | Attending: Family Medicine | Admitting: Family Medicine

## 2014-04-04 ENCOUNTER — Encounter (HOSPITAL_COMMUNITY): Payer: Self-pay | Admitting: Emergency Medicine

## 2014-04-04 DIAGNOSIS — S61452A Open bite of left hand, initial encounter: Secondary | ICD-10-CM

## 2014-04-04 DIAGNOSIS — S01309A Unspecified open wound of unspecified ear, initial encounter: Secondary | ICD-10-CM

## 2014-04-04 DIAGNOSIS — S61409A Unspecified open wound of unspecified hand, initial encounter: Secondary | ICD-10-CM

## 2014-04-04 DIAGNOSIS — Z23 Encounter for immunization: Secondary | ICD-10-CM

## 2014-04-04 DIAGNOSIS — T148XXA Other injury of unspecified body region, initial encounter: Secondary | ICD-10-CM

## 2014-04-04 DIAGNOSIS — S01311A Laceration without foreign body of right ear, initial encounter: Secondary | ICD-10-CM

## 2014-04-04 DIAGNOSIS — W503XXA Accidental bite by another person, initial encounter: Secondary | ICD-10-CM

## 2014-04-04 DIAGNOSIS — Y990 Civilian activity done for income or pay: Secondary | ICD-10-CM

## 2014-04-04 DIAGNOSIS — IMO0002 Reserved for concepts with insufficient information to code with codable children: Secondary | ICD-10-CM

## 2014-04-04 MED ORDER — AMOXICILLIN-POT CLAVULANATE 875-125 MG PO TABS: 1.0000 | ORAL_TABLET | Freq: Two times a day (BID) | ORAL | Status: DC

## 2014-04-04 MED ORDER — TETANUS-DIPHTH-ACELL PERTUSSIS 5-2.5-18.5 LF-MCG/0.5 IM SUSP
0.5000 mL | Freq: Once | INTRAMUSCULAR | Status: AC
  Administered 2014-04-04: 0.5 mL via INTRAMUSCULAR

## 2014-04-04 MED ORDER — TETANUS-DIPHTH-ACELL PERTUSSIS 5-2.5-18.5 LF-MCG/0.5 IM SUSP
INTRAMUSCULAR | Status: AC
  Filled 2014-04-04: qty 0.5
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
## 2014-04-11 ENCOUNTER — Encounter (HOSPITAL_COMMUNITY): Payer: Self-pay | Admitting: Emergency Medicine

## 2014-04-11 ENCOUNTER — Emergency Department (INDEPENDENT_AMBULATORY_CARE_PROVIDER_SITE_OTHER)
Admission: EM | Admit: 2014-04-11 | Discharge: 2014-04-11 | Disposition: A | Discharge status: Home / Self Care | Payer: No Typology Code available for payment source | Source: Home / Self Care | Attending: Family Medicine | Admitting: Family Medicine

## 2014-04-11 DIAGNOSIS — S01311D Laceration without foreign body of right ear, subsequent encounter: Secondary | ICD-10-CM

## 2014-04-11 DIAGNOSIS — Z4802 Encounter for removal of sutures: Secondary | ICD-10-CM

## 2014-04-11 NOTE — ED Provider Notes (Signed)
Harry Gonzales is a 43 y.o. male who presents to Urgent Care today for laceration right ear. Patient was seen June 19 for laceration to the right ear. He had 2 simple interrupted sutures placed. He is here today for followup. He feels well. No discharge or pain fevers or chills.   Past Medical History  Diagnosis Date  . Hypercholesterolemia    History  Substance Use Topics  . Smoking status: Current Every Day Smoker  . Smokeless tobacco: Not on file  . Alcohol Use: Yes   ROS as above Medications: No current facility-administered medications for this encounter.   Current Outpatient Prescriptions  Medication Sig Dispense Refill  . amoxicillin-clavulanate (AUGMENTIN) 875-125 MG per tablet Take 1 tablet by mouth 2 (two) times daily.  14 tablet  0    Exam:  BP 142/102  Pulse 112  Temp(Src) 98.2 F (36.8 C) (Oral)  Resp 18  SpO2 99% Gen: Well NAD Right ear: Well appearing laceration with 2 simple interrupted sutures. No exudate or tenderness or erythema  The left ulnar hand is also well-appearing no erythema or tenderness  2 simple interrupted sutures removed  No results found for this or any previous visit (from the past 24 hour(s)). No results found.  Assessment and Plan: 43 y.o. male with suture removal for right ear laceration. Patient is doing well. Blood pressure elevated. Recommend followup with primary care provider.  Discussed warning signs or symptoms. Please see discharge instructions. Patient expresses understanding.    Rodolph BongEvan S Corey, MD 04/11/14 1046

## 2014-04-11 NOTE — ED Notes (Signed)
Pt is here to have stitches removed [facility] Alert w/no signs of acute distress.

## 2014-04-11 NOTE — Discharge Instructions (Signed)
Thank you for coming in today. Your blood pressure is elevated. Please follow up with your doctor soon.    Scar Minimization You will have a scar anytime you have surgery and a cut is made in the skin or you have something removed from your skin (mole, skin cancer, cyst). Although scars are unavoidable following surgery, there are ways to minimize their appearance. It is important to follow all the instructions you receive from your caregiver about wound care. How your wound heals will influence the appearance of your scar. If you do not follow the wound care instructions as directed, complications such as infection may occur. Wound instructions include keeping the wound clean, moist, and not letting the wound form a scab. Some people form scars that are raised and lumpy (hypertrophic) or larger than the initial wound (keloidal). HOME CARE INSTRUCTIONS   Follow wound care instructions as directed.  Keep the wound clean by washing it with soap and water.  Keep the wound moist with provided antibiotic cream or petroleum jelly until completely healed. Moisten twice a day for about 2 weeks.  Get stitches (sutures) taken out at the scheduled time.  Avoid touching or manipulating your wound unless needed. Wash your hands thoroughly before and after touching your wound.  Follow all restrictions such as limits on exercise or work. This depends on where your scar is located.  Keep the scar protected from sunburn. Cover the scar with sunscreen/sunblock with SPF 30 or higher.  Gently massage the scar using a circular motion to help minimize the appearance of the scar. Do this only after the wound has closed and all the sutures have been removed.  For hypertrophic or keloidal scars, there are several ways to treat and minimize their appearance. Methods include compression therapy, intralesional corticosteroids, laser therapy, or surgery. These methods are performed by your caregiver. Remember that the  scar may appear lighter or darker than your normal skin color. This difference in color should even out with time. SEEK MEDICAL CARE IF:   You have a fever.  You develop signs of infection such as pain, redness, pus, and warmth.  You have questions or concerns. Document Released: 03/23/2010 Document Revised: 12/26/2011 Document Reviewed: 03/23/2010 [facility]Exit[facility] Patient Information 2015 [place]ExitCare, [place]LLC. This information is not intended to replace advice given to you by your health care provider. Make sure you discuss any questions you have with your health care provider.

## 2016-01-28 ENCOUNTER — Telehealth: Payer: Self-pay

## 2016-01-28 NOTE — Telephone Encounter (Signed)
Returned call but pt no longer needed our services.

## 2016-01-28 NOTE — Telephone Encounter (Signed)
Lm asking [facility] to return his call did not leave reason [phone]

## 2016-07-20 ENCOUNTER — Ambulatory Visit: Payer: Self-pay

## 2017-01-09 ENCOUNTER — Encounter (HOSPITAL_COMMUNITY): Payer: Self-pay | Admitting: *Deleted

## 2017-01-09 ENCOUNTER — Emergency Department (HOSPITAL_COMMUNITY)
Admission: EM | Admit: 2017-01-09 | Discharge: 2017-01-09 | Disposition: A | Discharge status: Home / Self Care | Payer: Self-pay | Attending: Emergency Medicine | Admitting: Emergency Medicine

## 2017-01-09 ENCOUNTER — Emergency Department (HOSPITAL_COMMUNITY): Payer: Self-pay

## 2017-01-09 DIAGNOSIS — R200 Anesthesia of skin: Secondary | ICD-10-CM | POA: Insufficient documentation

## 2017-01-09 DIAGNOSIS — R930 Abnormal findings on diagnostic imaging of skull and head, not elsewhere classified: Secondary | ICD-10-CM | POA: Insufficient documentation

## 2017-01-09 DIAGNOSIS — R0789 Other chest pain: Secondary | ICD-10-CM | POA: Insufficient documentation

## 2017-01-09 DIAGNOSIS — Z87891 Personal history of nicotine dependence: Secondary | ICD-10-CM | POA: Insufficient documentation

## 2017-01-09 LAB — COMPREHENSIVE METABOLIC PANEL
ALBUMIN: 4.9 g/dL (ref 3.5–5.0)
ALK PHOS: 35 U/L — AB (ref 38–126)
ALT: 29 U/L (ref 17–63)
ANION GAP: 9 (ref 5–15)
AST: 30 U/L (ref 15–41)
BUN: 17 mg/dL (ref 6–20)
CALCIUM: 9.8 mg/dL (ref 8.9–10.3)
CO2: 27 mmol/L (ref 22–32)
Chloride: 103 mmol/L (ref 101–111)
Creatinine, Ser: 1.09 mg/dL (ref 0.61–1.24)
GFR calc Af Amer: >60 mL/min (ref >60)
GFR calc non Af Amer: >60 mL/min (ref >60)
GLUCOSE: 98 mg/dL (ref 65–99)
Potassium: 4.5 mmol/L (ref 3.5–5.1)
SODIUM: 139 mmol/L (ref 135–145)
Total Bilirubin: 0.8 mg/dL (ref 0.3–1.2)
Total Protein: 8 g/dL (ref 6.5–8.1)

## 2017-01-09 LAB — CBC
HCT: 44.6 % (ref 39.0–52.0)
Hemoglobin: 15.6 g/dL (ref 13.0–17.0)
MCH: 34.3 pg — ABNORMAL HIGH (ref 26.0–34.0)
MCHC: 35 g/dL (ref 30.0–36.0)
MCV: 98 fL (ref 78.0–100.0)
PLATELETS: 197 10*3/uL (ref 150–400)
RBC: 4.55 MIL/uL (ref 4.22–5.81)
RDW: 12.5 % (ref 11.5–15.5)
WBC: 8.9 10*3/uL (ref 4.0–10.5)

## 2017-01-09 LAB — PROTIME-INR
INR: 0.97
PROTHROMBIN TIME: 12.9 s (ref 11.4–15.2)

## 2017-01-09 LAB — I-STAT TROPONIN, ED: Troponin i, poc: 0 ng/mL (ref 0.00–0.08)

## 2017-01-09 LAB — DIFFERENTIAL
Basophils Absolute: 0 10*3/uL (ref 0.0–0.1)
Basophils Relative: 0 %
Eosinophils Absolute: 0.3 10*3/uL (ref 0.0–0.7)
Eosinophils Relative: 4 %
LYMPHS PCT: 40 %
Lymphs Abs: 3.5 10*3/uL (ref 0.7–4.0)
Monocytes Absolute: 0.5 10*3/uL (ref 0.1–1.0)
Monocytes Relative: 5 %
NEUTROS ABS: 4.5 10*3/uL (ref 1.7–7.7)
NEUTROS PCT: 51 %

## 2017-01-09 LAB — I-STAT CHEM 8, ED
BUN: 27 mg/dL — AB (ref 6–20)
CHLORIDE: 102 mmol/L (ref 101–111)
Calcium, Ion: 1.25 mmol/L (ref 1.15–1.40)
Creatinine, Ser: 1 mg/dL (ref 0.61–1.24)
Glucose, Bld: 97 mg/dL (ref 65–99)
HEMATOCRIT: 47 % (ref 39.0–52.0)
Hemoglobin: 16 g/dL (ref 13.0–17.0)
POTASSIUM: 4.4 mmol/L (ref 3.5–5.1)
SODIUM: 140 mmol/L (ref 135–145)
TCO2: 30 mmol/L (ref 0–100)

## 2017-01-09 LAB — APTT: APTT: 31 s (ref 24–36)

## 2017-01-09 NOTE — ED Triage Notes (Addendum)
Pt states when he woke up this am he felt his heart racing and feeling dizzy.  Then he noticed that the L side of his temple/forehead and L arm felt numb and tingly.  He went to see his pcp who sent him here to r/o stroke.  Pt states he drank more than usual this weekend.

## 2017-01-09 NOTE — Discharge Instructions (Signed)
As discussed, your evaluation today has been largely reassuring.  But, it is important that you monitor your condition carefully, and do not hesitate to return to the ED if you develop new, or concerning changes in your condition. ? ?Otherwise, please follow-up with your physician for appropriate ongoing care. ? ?

## 2017-01-09 NOTE — ED Provider Notes (Signed)
[facility] DEPT Provider Note   CSN: [id number] Arrival date & time: 01/09/17  1301     History   Chief Complaint No chief complaint on file.   HPI Harry Gonzales is a 46 y.o. male.  HPI  Patient presents with concern of left facial paresthesia, left chest pain. Patient states that he is generally well, had no notable changes in his recent health condition. This morning, after making the patient noticed some intermittent hot sensation in his left face, but no inability to speak, breath, swallow. There is no Precipitant. Currently the symptoms are minimal, but he states that he does not feel quite normal yet. Patient also had one brief episode of left-sided chest pain, but no cough, no fever. Patient recalls having more alcohol than usual last night, but otherwise no other recent social changes. He smokes cigars, not cigarettes.   Past Medical History:  Diagnosis Date  . Hypercholesterolemia     There are no active problems to display for this patient.   History reviewed. No pertinent surgical history.     Home Medications    Prior to Admission medications   Medication Sig Start Date End Date Taking? Authorizing Provider  amoxicillin-clavulanate (AUGMENTIN) 875-125 MG per tablet Take 1 tablet by mouth 2 (two) times daily. 04/04/14   Rodolph BongEvan S Corey, MD    Family History No family history on file.  Social History Social History  Substance Use Topics  . Smoking status: Former Smoker    Quit date: 01/10/2016  . Smokeless tobacco: Never Used  . Alcohol use Yes     Comment: several shots and several 24 oz beers per day     Allergies   Codeine   Review of Systems Review of Systems  Constitutional:       Per HPI, otherwise negative  HENT:       Per HPI, otherwise negative  Respiratory:       Per HPI, otherwise negative  Cardiovascular:       Per HPI, otherwise negative  Gastrointestinal: Negative for vomiting.  Endocrine:       Negative aside from HPI   Genitourinary:       Neg aside from HPI   Musculoskeletal:       Per HPI, otherwise negative  Skin: Negative.   Neurological: Negative for syncope.     Physical Exam Updated Vital Signs BP (!) 144/96 (BP Location: Left Arm)   Pulse 88   Temp 98.3 F (36.8 C) (Oral)   Resp 18   Ht 5' 10.5" (1.791 m)   Wt 210 lb (95.3 kg)   SpO2 100%   BMI 29.71 kg/m   Physical Exam  Constitutional: He is oriented to person, place, and time. He appears well-developed. No distress.  HENT:  Head: Normocephalic and atraumatic.  Eyes: Conjunctivae and EOM are normal.  Cardiovascular: Normal rate and regular rhythm.   Pulmonary/Chest: Effort normal. No stridor. No respiratory distress.  Abdominal: He exhibits no distension.  Musculoskeletal: He exhibits no edema.  Neurological: He is alert and oriented to person, place, and time. He displays no atrophy and no tremor. No cranial nerve deficit. He displays no seizure activity.  Skin: Skin is warm and dry.  Psychiatric: He has a normal mood and affect.  Nursing note and vitals reviewed.    ED Treatments / Results  Labs (all labs ordered are listed, but only abnormal results are displayed) Labs Reviewed  CBC - Abnormal; Notable for the following:  Result Value   MCH 34.3 (*)    All other components within normal limits  COMPREHENSIVE METABOLIC PANEL - Abnormal; Notable for the following:    Alkaline Phosphatase 35 (*)    All other components within normal limits  I-STAT CHEM 8, ED - Abnormal; Notable for the following:    BUN 27 (*)    All other components within normal limits  PROTIME-INR  APTT  DIFFERENTIAL  I-STAT TROPOININ, ED    EKG  EKG Interpretation  Date/Time:  Monday January 09 2017 13:06:43 EDT Ventricular Rate:  86 PR Interval:  138 QRS Duration: 88 QT Interval:  348 QTC Calculation: 416 R Axis:   85 Text Interpretation:  Sinus rhythm T wave abnormality artefact in V5 Abnormal ekg Confirmed by Gerhard Munch   MD [phone]) on 01/09/2017 3:19:38 PM       Radiology Ct Head Wo Contrast  Result Date: 01/09/2017 CLINICAL DATA:  New onset left-sided facial and arm numbness beginning today. Symptoms have improved without complete resolution. Dizziness. EXAM: CT HEAD WITHOUT CONTRAST TECHNIQUE: Contiguous axial images were obtained from the base of the skull through the vertex without intravenous contrast. COMPARISON:  None. FINDINGS: Brain: No acute infarct, hemorrhage, or mass lesion is present. The ventricles are of normal size. No significant extraaxial fluid collection is present. Brainstem and cerebellum are normal. Vascular: No hyperdense vessel or unexpected calcification. Skull: The calvarium is intact. No focal lytic or blastic lesions are present. Sinuses/Orbits: The paranasal sinuses and mastoid air cells are clear. The globes and orbits are normal. IMPRESSION: Negative CT of the head. Electronically Signed   By: Marin Roberts M.D.   On: 01/09/2017 15:17    Procedures Procedures (including critical care time)  Medications Ordered in ED Medications - No data to display   Initial Impression / Assessment and Plan / ED Course  I have reviewed the triage vital signs and the nursing notes.  Pertinent labs & imaging results that were available during my care of the patient were reviewed by me and considered in my medical decision making (see chart for details).  On repeat exam the patient is in no distress, is awake, alert, moving all extremity spontaneously. We had a lengthy conversation about findings, need help with primary care, and absent evidence for stroke, pneumonia, ACS, PE, with reassuring vitals, labs, patient will follow-up as an outpatient.  Final Clinical Impressions(s) / ED Diagnoses  Atypical chest pain Numbness    Gerhard Munch, MD 01/09/17 1752

## 2017-02-23 ENCOUNTER — Emergency Department (HOSPITAL_BASED_OUTPATIENT_CLINIC_OR_DEPARTMENT_OTHER)
Admit: 2017-02-23 | Discharge: 2017-02-23 | Disposition: A | Discharge status: Home / Self Care | Payer: No Typology Code available for payment source | Attending: Emergency Medicine | Admitting: Emergency Medicine

## 2017-02-23 ENCOUNTER — Emergency Department (HOSPITAL_COMMUNITY)
Admission: EM | Admit: 2017-02-23 | Discharge: 2017-02-23 | Disposition: A | Discharge status: Home / Self Care | Payer: Self-pay | Attending: Emergency Medicine | Admitting: Emergency Medicine

## 2017-02-23 ENCOUNTER — Encounter (HOSPITAL_COMMUNITY): Payer: Self-pay

## 2017-02-23 DIAGNOSIS — M79604 Pain in right leg: Secondary | ICD-10-CM | POA: Insufficient documentation

## 2017-02-23 DIAGNOSIS — Z79899 Other long term (current) drug therapy: Secondary | ICD-10-CM | POA: Insufficient documentation

## 2017-02-23 DIAGNOSIS — M79609 Pain in unspecified limb: Secondary | ICD-10-CM

## 2017-02-23 DIAGNOSIS — Z87891 Personal history of nicotine dependence: Secondary | ICD-10-CM | POA: Insufficient documentation

## 2017-02-23 LAB — I-STAT CHEM 8, ED
BUN: 23 mg/dL — ABNORMAL HIGH (ref 6–20)
CALCIUM ION: 1.14 mmol/L — AB (ref 1.15–1.40)
CREATININE: 1.1 mg/dL (ref 0.61–1.24)
Chloride: 105 mmol/L (ref 101–111)
GLUCOSE: 90 mg/dL (ref 65–99)
HCT: 44 % (ref 39.0–52.0)
HEMOGLOBIN: 15 g/dL (ref 13.0–17.0)
POTASSIUM: 3.7 mmol/L (ref 3.5–5.1)
Sodium: 140 mmol/L (ref 135–145)
TCO2: 25 mmol/L (ref 0–100)

## 2017-02-23 NOTE — ED Notes (Signed)
Pt negative for dvt per vascular

## 2017-02-23 NOTE — Progress Notes (Signed)
**  Preliminary report by tech**  Right lower extremity venous duplex complete. There is no evidence of deep or superficial vein thrombosis involving the right lower extremity. All clearly visualized vessels appear patent and compressible. There is no evidence of a Baker's cyst on the right. [facility].  02/23/17 7:04 PM Olen CordialGreg Xia Stohr RVT

## 2017-02-23 NOTE — ED Provider Notes (Signed)
[facility] Provider Note   CSN: [id number] Arrival date & time: 02/23/17  1547     History   Chief Complaint Chief Complaint  Patient presents with  . Leg Pain    HPI Harry Gonzales is a 46 y.o. male.  The history is provided by the patient.  Leg Pain   This is a new problem. Episode onset: 1 week. The problem occurs constantly. The problem has been gradually improving. The pain is present in the right lower leg. The quality of the pain is described as aching. The pain is moderate. Pertinent negatives include no numbness, full range of motion, no stiffness, no tingling and no itching. He has tried nothing for the symptoms. There has been no history of extremity trauma.   Seen by his PCP and sent here to rule out DVT.  Past Medical History:  Diagnosis Date  . Hypercholesterolemia     There are no active problems to display for this patient.   History reviewed. No pertinent surgical history.     Home Medications    Prior to Admission medications   Medication Sig Start Date End Date Taking? Authorizing Provider  amoxicillin-clavulanate (AUGMENTIN) 875-125 MG per tablet Take 1 tablet by mouth 2 (two) times daily. 04/04/14   Rodolph Bong, MD    Family History No family history on file.  Social History Social History  Substance Use Topics  . Smoking status: Former Smoker    Quit date: 01/10/2016  . Smokeless tobacco: Never Used  . Alcohol use Yes     Comment: several shots and several 24 oz beers per day     Allergies   Codeine   Review of Systems Review of Systems  Musculoskeletal: Negative for stiffness.  Skin: Negative for itching.  Neurological: Negative for tingling and numbness.  All other systems are reviewed and are negative for acute change except as noted in the HPI    Physical Exam Updated Vital Signs BP 137/87 (BP Location: Left Arm)   Pulse 73   Temp 97.7 F (36.5 C) (Oral)   Resp 18   Ht 5\' 10"  (1.778 m)   Wt 210 lb (95.3 kg)    SpO2 93%   BMI 30.13 kg/m   Physical Exam  Constitutional: He is oriented to person, place, and time. He appears well-developed and well-nourished. No distress.  HENT:  Head: Normocephalic and atraumatic.  Right Ear: External ear normal.  Left Ear: External ear normal.  Nose: Nose normal.  Mouth/Throat: Mucous membranes are normal. No trismus in the jaw.  Eyes: Conjunctivae and EOM are normal. No scleral icterus.  Neck: Normal range of motion and phonation normal.  Cardiovascular: Normal rate and regular rhythm.   Pulses:      Dorsalis pedis pulses are 2+ on the right side, and 2+ on the left side.  Pulmonary/Chest: Effort normal. No stridor. No respiratory distress.  Abdominal: He exhibits no distension.  Musculoskeletal: Normal range of motion. He exhibits no edema.       Right lower leg: He exhibits no tenderness, no bony tenderness, no swelling and no edema.  Neurological: He is alert and oriented to person, place, and time.  Skin: He is not diaphoretic.  Psychiatric: He has a normal mood and affect. His behavior is normal.  Vitals reviewed.    ED Treatments / Results  Labs (all labs ordered are listed, but only abnormal results are displayed) Labs Reviewed  I-STAT CHEM 8, ED - Abnormal; Notable for the  following:       Result Value   BUN 23 (*)    Calcium, Ion 1.14 (*)    All other components within normal limits    EKG  EKG Interpretation None       Radiology No results found.  Procedures Procedures (including critical care time)  Medications Ordered in ED Medications - No data to display   Initial Impression / Assessment and Plan / ED Course  I have reviewed the triage vital signs and the nursing notes.  Pertinent labs & imaging results that were available during my care of the patient were reviewed by me and considered in my medical decision making (see chart for details).     Ultrasound negative for DVT. Labs grossly reassuring without  electrolyte derangement. No evidence of cellulitis. Low suspicion for arterial occlusion.  Safe for discharge with strict return precautions and follow-up with PCP.  Final Clinical Impressions(s) / ED Diagnoses   Final diagnoses:  Right leg pain   Disposition: Discharge  Condition: Good  I have discussed the results, Dx and Tx plan with the patient who expressed understanding and agree(s) with the plan. Discharge instructions discussed at great length. The patient was given strict return precautions who verbalized understanding of the instructions. No further questions at time of discharge.    New Prescriptions   No medications on file    Follow Up: Knox RoyaltyJones, Enrico, MD [address] [place] [address] [phone]  Schedule an appointment as soon as possible for a visit  As needed      Barnard Sharps, Amadeo GarnetPedro Eduardo, MD 02/23/17 2304

## 2017-02-23 NOTE — ED Notes (Signed)
Pt ambulated to lobby and had no questions prior to discharge.

## 2017-02-23 NOTE — ED Triage Notes (Signed)
Pt sent from pt PCP with right leg pain since last Thursday. Pt was sent for US to rule out blood clot. Pt denies chest pain or sob, or being on blood thinners. No hx of clot.

## 2017-11-24 ENCOUNTER — Ambulatory Visit (INDEPENDENT_AMBULATORY_CARE_PROVIDER_SITE_OTHER): Payer: Self-pay | Admitting: Pharmacist

## 2017-11-24 DIAGNOSIS — Z7251 High risk heterosexual behavior: Secondary | ICD-10-CM

## 2017-11-24 NOTE — Progress Notes (Signed)
[facility] Pharmacy Visit  HPI: Harry Gonzales is a 47 y.o. male who presents to the [facility] pharmacy clinic to initiate PrEP.  There are no active problems to display for this patient.   Patient's Medications  New Prescriptions   No medications on file  Previous Medications   AMOXICILLIN-CLAVULANATE (AUGMENTIN) 875-125 MG PER TABLET    Take 1 tablet by mouth 2 (two) times daily.  Modified Medications   No medications on file  Discontinued Medications   No medications on file    Allergies: Allergies  Allergen Reactions  . Codeine Nausea And Vomiting    Past Medical History: Past Medical History:  Diagnosis Date  . Hypercholesterolemia     Social History: Social History   Socioeconomic History  . Marital status: Divorced    Spouse name: Not on file  . Number of children: Not on file  . Years of education: Not on file  . Highest education level: Not on file  Social Needs  . Financial resource strain: Not on file  . Food insecurity - worry: Not on file  . Food insecurity - inability: Not on file  . Transportation needs - medical: Not on file  . Transportation needs - non-medical: Not on file  Occupational History  . Not on file  Tobacco Use  . Smoking status: Former Smoker    Last attempt to quit: 01/10/2016    Years since quitting: 1.8  . Smokeless tobacco: Never Used  Substance and Sexual Activity  . Alcohol use: Yes    Comment: several shots and several 24 oz beers per day  . Drug use: No  . Sexual activity: Yes  Other Topics Concern  . Not on file  Social History Narrative  . Not on file    Labs: No results found for: HIV1RNAQUANT, HIV1RNAVL, CD4TABS, HEPBSAB, HEPBSAG, HCVAB  HIV Pre-Exposure Prophylaxis (PrEP): Patient's risk for HIV: high risk sexual activity, random partners Sexual partner preference: male Number of sexual partners in the last 3 months: 5 Condom use? 99% Sexual activity preference: insertive  vaginal Symptoms of acute HIV? none Last date of BMET: 01/09/17 Last date of STI testing: N/A Last negative HIV antibody test: N/A  Assessment: Harry Gonzales is here today to initiate PrEP.  He is a relatively low risk patient but does want to start PrEP today as he has sex only with females and uses condoms most of the time but does have many partners throughout the month.  He has had ~5 male partners in the previous 3 months and uses condoms ~99% of the time. He has tested positive for gonorrhea at one point in time but does not remember when.  He heard about our PrEP program from [facility].  He knew a little bit about Truvada at baseline.  I explained what Truvada is and how to take it including with or without a meal and the need to take it consistently each day.  I also encouraged condom use to prevent STDs and HIV as well.  I explained possible side effects but told him he would probably tolerate it just fine. 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 I will get baseline labs today - HIV antibody, BMET, RPR, urine g/c, chlamydia, RPR, and Hep B serologies. He will come back in 3 months to see me again.  Plan: - Truvada x 3 months if HIV negative -  HIV antibody, urine g/c, RPR, BMET, Hep B serologies today - [facility] - F/u with me again 5/6 at 10am   L. , PharmD, AAHIVP, CPP Infectious Diseases Clinical Pharmacist [facility] 11/24/2017, 11:49 AM

## 2017-11-27 ENCOUNTER — Telehealth: Payer: Self-pay | Admitting: Pharmacist

## 2017-11-27 ENCOUNTER — Other Ambulatory Visit: Payer: Self-pay | Admitting: Pharmacist

## 2017-11-27 LAB — RPR: RPR: NONREACTIVE

## 2017-11-27 LAB — BASIC METABOLIC PANEL
BUN: 16 mg/dL (ref 7–25)
CALCIUM: 9.5 mg/dL (ref 8.6–10.3)
CO2: 25 mmol/L (ref 20–32)
Chloride: 103 mmol/L (ref 98–110)
Creat: 1.06 mg/dL (ref 0.60–1.35)
GLUCOSE: 97 mg/dL (ref 65–99)
Potassium: 4.3 mmol/L (ref 3.5–5.3)
Sodium: 137 mmol/L (ref 135–146)

## 2017-11-27 LAB — HIV ANTIBODY (ROUTINE TESTING W REFLEX): HIV: NONREACTIVE

## 2017-11-27 LAB — HEPATITIS B SURFACE ANTIGEN: Hepatitis B Surface Ag: NONREACTIVE

## 2017-11-27 LAB — HEPATITIS B SURFACE ANTIBODY,QUALITATIVE: HEP B S AB: REACTIVE — AB

## 2017-11-27 LAB — URINE CYTOLOGY ANCILLARY ONLY
CHLAMYDIA, DNA PROBE: NEGATIVE
NEISSERIA GONORRHEA: NEGATIVE

## 2017-11-27 NOTE — Telephone Encounter (Signed)
Spoke with lab and the test would cost ~$249.  Told this to patient and he does not wish to get tested.  I told him if he had any active lesions that we could swab, it would be better but since he does not, it is totally fine to defer testing.

## 2017-11-27 NOTE — Telephone Encounter (Signed)
That is quite reasonable thanks Cassie

## 2017-11-27 NOTE — Telephone Encounter (Signed)
Perfect

## 2017-11-27 NOTE — Telephone Encounter (Signed)
Spoke to Harry Gonzales and told him his HIV antibody was negative. Will [facility] an [facility] Truvada and let him know when he is approved.

## 2017-11-27 NOTE — Telephone Encounter (Signed)
Thanks Dr. Van Dam 

## 2017-11-27 NOTE — Telephone Encounter (Signed)
Patient is in our uninsured PrEP program. He called this morning asking about being tested for Herpes.  He stated he had a girlfriend "back in the day" that had a herpes outbreak.  I told him that it wasn't something we usually test for unless he has active lesions or sores (which he does not have). Also told him that it would not be covered under our uninsured program.  He is asking how much it would be to test for that and to pay out of pocket even though he does not have any active lesions. Will discuss with lab and Dr. Daiva EvesVan Dam.

## 2017-11-29 ENCOUNTER — Other Ambulatory Visit: Payer: Self-pay | Admitting: Pharmacist

## 2017-11-30 ENCOUNTER — Telehealth: Payer: Self-pay | Admitting: Pharmacist

## 2017-11-30 ENCOUNTER — Other Ambulatory Visit: Payer: Self-pay | Admitting: Pharmacist

## 2017-11-30 DIAGNOSIS — Z7251 High risk heterosexual behavior: Secondary | ICD-10-CM

## 2017-11-30 MED ORDER — EMTRICITABINE-TENOFOVIR DF 200-300 MG PO TABS: 1.0000 | ORAL_TABLET | Freq: Every day | ORAL | 0 refills | Status: DC

## 2017-11-30 NOTE — Telephone Encounter (Signed)
Perfect

## 2017-11-30 NOTE — Telephone Encounter (Signed)
Harry Gonzales is approved for Truvada for PrEP through [employer] uninsured program until 11/30/18. Called him just now to let him know but had to leave a voicemail.  [facility].

## 2017-12-04 ENCOUNTER — Other Ambulatory Visit: Payer: Self-pay | Admitting: Pharmacist

## 2017-12-04 DIAGNOSIS — Z7251 High risk heterosexual behavior: Secondary | ICD-10-CM

## 2017-12-04 MED ORDER — EMTRICITABINE-TENOFOVIR DF 200-300 MG PO TABS: 1.0000 | ORAL_TABLET | Freq: Every day | ORAL | 2 refills | Status: DC

## 2017-12-04 MED FILL — TRUVADA 200-300 MG TABS: 200-300 | 30 days supply | Qty: 30 | Fill #0

## 2017-12-15 ENCOUNTER — Telehealth: Payer: Self-pay | Admitting: Pharmacist Clinician (PhC)/ Clinical Pharmacy Specialist

## 2017-12-15 NOTE — Telephone Encounter (Signed)
Harry Gonzales called to complain about the Truvada causing him a lot of drowsiness. Advised him to take it at night. He is also started taking a male enhancement product that makes it worse. Told him that the side effects is probably more related to that product rather than Truvada. He would like a call back from Cassie.

## 2017-12-18 NOTE — Telephone Encounter (Signed)
Called Harry Gonzales back regarding his side effects.  He was reporting some grogginess and dizziness with Truvada. But like Harry Gonzales's note states, he is also taking a male enhancement product.  He does state that the side effects have resolved.  I told him it takes the body a few weeks to adjust to the mediation and it should go away.

## 2018-01-02 MED FILL — TRUVADA 200-300 MG TABS: 200-300 | 30 days supply | Qty: 30 | Fill #0

## 2018-01-29 ENCOUNTER — Telehealth: Payer: Self-pay | Admitting: Pharmacist

## 2018-01-29 NOTE — Telephone Encounter (Signed)
Harry Gonzales is one of my PrEP patients on Truvada.  He called worried that he saw things on tv and facebook about Truvada causing kidney failure.  I explained to him the risk of declined kidney function with Truvada and that it takes years to see a difference and it wouldn't necessarily for sure happen to him. I also explained the decreased bone mineral density as well. Also explained how they are studying Descovy for PrEP and told him his kidney function was normal when we checked in February and that we monitor it twice a year.  He would like us to check it again when he comes for his 3 month f/u in May. I told him we could do that. He is not on any other kidney toxic medications.  He is not at an extremely high risk for acquiring HIV (heterosexual but with multiple male partners). He wishes to continue taking Truvada for now.

## 2018-01-29 NOTE — Telephone Encounter (Signed)
No problem and Descovy will likely get approved. Just not sure of the PDUFA date.

## 2018-02-05 MED FILL — TRUVADA 200-300 MG TABS: 200-300 | 30 days supply | Qty: 30 | Fill #1

## 2018-02-19 ENCOUNTER — Ambulatory Visit: Payer: Self-pay

## 2018-02-21 ENCOUNTER — Telehealth: Payer: Self-pay | Admitting: Pharmacist

## 2018-02-21 ENCOUNTER — Ambulatory Visit (INDEPENDENT_AMBULATORY_CARE_PROVIDER_SITE_OTHER): Payer: Self-pay | Admitting: Pharmacist

## 2018-02-21 ENCOUNTER — Ambulatory Visit: Payer: Self-pay

## 2018-02-21 DIAGNOSIS — Z7251 High risk heterosexual behavior: Secondary | ICD-10-CM

## 2018-02-21 NOTE — Progress Notes (Signed)
Date:  02/21/2018   HPI: Harry Gonzales is a 47 y.o. male who presents to the [facility] pharmacy clinic for his 3 month PrEP follow-up.  Insured      Uninsured     There are no active problems to display for this patient.   Patient's Medications  New Prescriptions   No medications on file  Previous Medications   EMTRICITABINE-TENOFOVIR (TRUVADA) 200-300 MG TABLET    Take 1 tablet by mouth daily.   FENOFIBRATE (TRICOR) 145 MG TABLET    Take 145 mg by mouth daily.   PRAVASTATIN (PRAVACHOL) 80 MG TABLET    Take 80 mg by mouth daily.  Modified Medications   No medications on file  Discontinued Medications   AMOXICILLIN-CLAVULANATE (AUGMENTIN) 875-125 MG PER TABLET    Take 1 tablet by mouth 2 (two) times daily.    Allergies: Allergies  Allergen Reactions  . Codeine Nausea And Vomiting    Past Medical History: Past Medical History:  Diagnosis Date  . Hypercholesterolemia     Social History: Social History   Socioeconomic History  . Marital status: Divorced    Spouse name: Not on file  . Number of children: Not on file  . Years of education: Not on file  . Highest education level: Not on file  Occupational History  . Not on file  Social Needs  . Financial resource strain: Not on file  . Food insecurity:    Worry: Not on file    Inability: Not on file  . Transportation needs:    Medical: Not on file    Non-medical: Not on file  Tobacco Use  . Smoking status: Former Smoker    Last attempt to quit: 01/10/2016    Years since quitting: 2.1  . Smokeless tobacco: Never Used  Substance and Sexual Activity  . Alcohol use: Yes    Comment: several shots and several 24 oz beers per day  . Drug use: No  . Sexual activity: Yes  Lifestyle  . Physical activity:    Days per week: Not on file    Minutes per session: Not on file  . Stress: Not on file  Relationships  . Social connections:    Talks on phone: Not on file    Gets together: Not on file    Attends religious  service: Not on file    Active member of club or organization: Not on file    Attends meetings of clubs or organizations: Not on file    Relationship status: Not on file  Other Topics Concern  . Not on file  Social History Narrative  . Not on file    CH[facility] HIV PREP FLOWSHEET RESULTS 02/21/2018 11/24/2017  Insurance Status Uninsured Uninsured  How did you hear? - [facility]  Gender at birth Male Male  Gender identity cis-Male cis-Male  Risk for HIV >5 partners in past 6 mos (regardless of condom use);Hx of STI >5 partners in past 6 mos (regardless of condom use);Condomless vaginal or anal intercourse  Sex Partners Women only Women only  # sex partners past 3-6 mos 1-3 4-6  Sex activity preferences Insertive Insertive  Condom use No Yes  % condom use - 99  Treated for STI? No -  HIV symptoms? N/A N/A    Labs:  SCr: Lab Results  Component Value Date   CREATININE 1.06 11/24/2017   CREATININE 1.10 02/23/2017   CREATININE 1.00 01/09/2017   CREATININE 1.09 01/09/2017   CREATININE 1.04 02/07/2012  HIV Lab Results  Component Value Date   HIV NON-REACTIVE 11/24/2017   Hepatitis B Lab Results  Component Value Date   HEPBSAB REACTIVE (A) 11/24/2017   HEPBSAG NON-REACTIVE 11/24/2017   Hepatitis C No results found for: HEPCAB, HCVRNAPCRQN Hepatitis A No results found for: HAV RPR and STI Lab Results  Component Value Date   LABRPR NON-REACTIVE 11/24/2017    STI Results GC CT  11/24/2017 Negative Negative    HIV Pre-Exposure Prophylaxis (PrEP): Patient's risk for HIV: multiple partners Sexual partner preference: male Number of sexual partners in the last 3 months: 1 Condom use? no Symptoms of acute HIV? none Last date of BMET: 11/24/17 Last date of STI testing: 11/24/17 Last negative HIV antibody test: 11/24/17  Assessment: Coty is here today to follow-up for PrEP.  He started Truvada ~3 months ago.  He is having no issues on Truvada.  He states he felt a "little  off" during the first 2 weeks but it resolved.  No missed doses. He took it late one day but no complete days missed.  He takes it in the morning and has ~2 weeks left in his current bottle. No problems getting [facility] from [facility].  When I started him on PrEP, he had ~5 male partners with inconsistent condom use.  He tells me today that he is in a relationship now with one partner with no condom use.  He states he and his girlfriend went to [facility] last week and both got tested for everything.  Myson is constantly worried about getting Hepatitis C and HIV. He states he gets tested monthly even if he only has one partner.   I told him that his risk for getting HIV was very low, especially if he just has one partner. With that being said, I would not discourage or prevent him from getting Truvada as he would like to continue.  He is still worried about declining kidney function with Truvada as he reads stuff online and sees advertisements from law firms regarding this.  Explained to him again that the risk is quite low and it is seen long term and more so in patients who are older and with baseline declined kidney function. I also told him about Descovy and how it was being pushed through the FDA for approval soon for PrEP. I will check a HIV antibody and BMET today. He wishes to have his kidney function checked every time he is seen. He is still uninsured.  Plan: - HIV antibody and BMET today - Truvada x 3 more months if HIV negative - F/u with me again 8/14 at 11am  Milaya Hora L. Jaloni Davoli, PharmD, AAHIVP, CPP Infectious Diseases Clinical Pharmacist [facility] 02/21/2018, 2:12 PM

## 2018-02-21 NOTE — Telephone Encounter (Signed)
Patient called me after he left this morning and told me he is getting charged for his PrEP visits.  He has a bill from me for ~$400. I told him to bring the bill to me ASAP and we will investigate.

## 2018-02-21 NOTE — Telephone Encounter (Signed)
Thanks Cassie I hope [facility] can fix this. This is a big problem.

## 2018-02-22 ENCOUNTER — Telehealth: Payer: Self-pay | Admitting: Pharmacist

## 2018-02-22 DIAGNOSIS — Z7251 High risk heterosexual behavior: Secondary | ICD-10-CM

## 2018-02-22 LAB — BASIC METABOLIC PANEL
BUN: 14 mg/dL (ref 7–25)
CALCIUM: 9.3 mg/dL (ref 8.6–10.3)
CO2: 26 mmol/L (ref 20–32)
CREATININE: 0.98 mg/dL (ref 0.60–1.35)
Chloride: 107 mmol/L (ref 98–110)
GLUCOSE: 100 mg/dL — AB (ref 65–99)
POTASSIUM: 3.8 mmol/L (ref 3.5–5.3)
Sodium: 138 mmol/L (ref 135–146)

## 2018-02-22 LAB — HIV ANTIBODY (ROUTINE TESTING W REFLEX): HIV 1&2 Ab, 4th Generation: NONREACTIVE

## 2018-02-22 MED ORDER — EMTRICITABINE-TENOFOVIR DF 200-300 MG PO TABS: 1.0000 | ORAL_TABLET | Freq: Every day | ORAL | 2 refills | Status: DC

## 2018-02-22 NOTE — Telephone Encounter (Signed)
Called Harpreet to let him know that his HIV antibody was negative and his kidney function looked good.  Will send in 3 more months of Truvada to [facility].

## 2018-03-01 MED FILL — TRUVADA 200-300 MG TABS: 200-300 | 30 days supply | Qty: 30 | Fill #0

## 2018-04-10 MED FILL — TRUVADA 200-300 MG TABS: 200-300 | 30 days supply | Qty: 30 | Fill #2

## 2018-05-09 IMAGING — CT CT HEAD W/O CM
3 of 4 series · 18 of 47 positions shown, 21 images · non-contrast
Comparison: None.

CLINICAL DATA: New onset left-sided facial and arm numbness
beginning today. Symptoms have improved without complete resolution.
Dizziness.

EXAM:
CT HEAD WITHOUT CONTRAST
TECHNIQUE: Contiguous axial images were obtained from the base of the skull
through the vertex without intravenous contrast.

[Series 201: head w/o, idose (1) · axial · non-contrast · 0.44mm/px · z∈[+77,+197]mm · 12 of 29 slices shown, 15 images]
[im 3/29  brain]
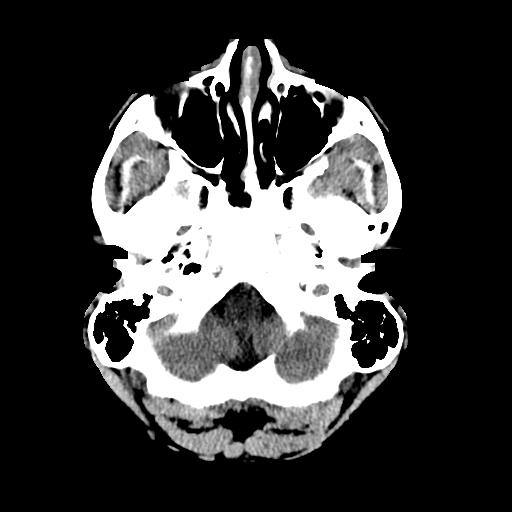
[im 3/29  bone]
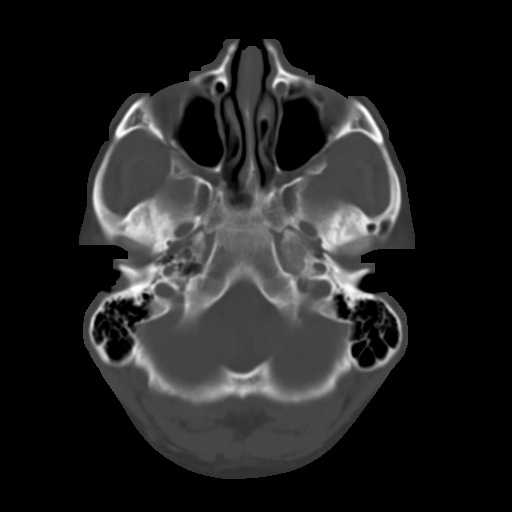
[im 5/29  brain]
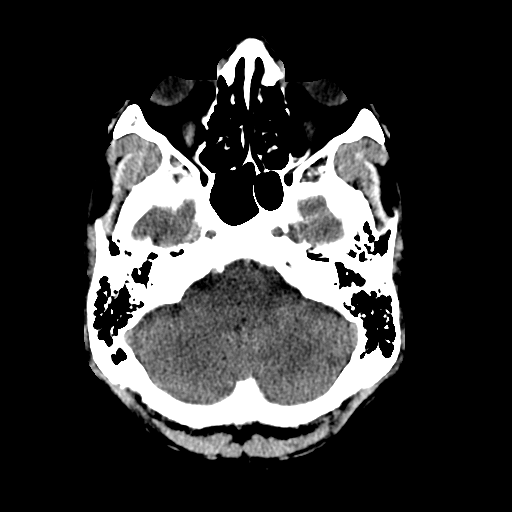
[im 7/29  brain]
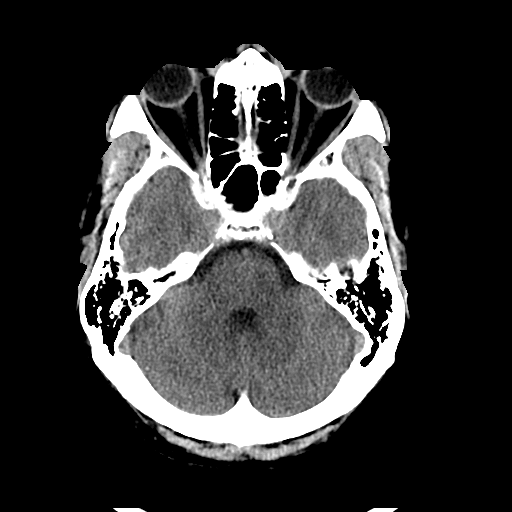
[im 9/29  brain]
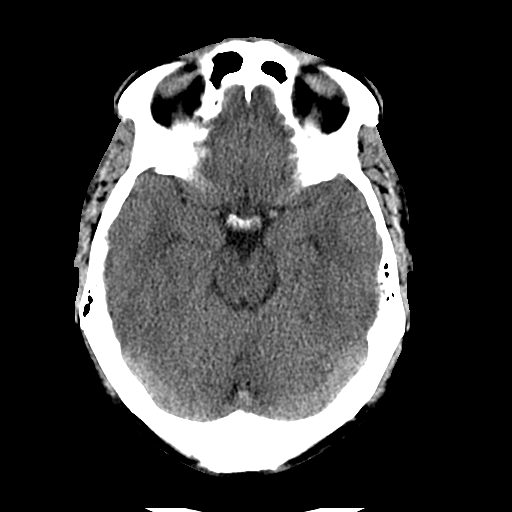
[im 11/29  brain]
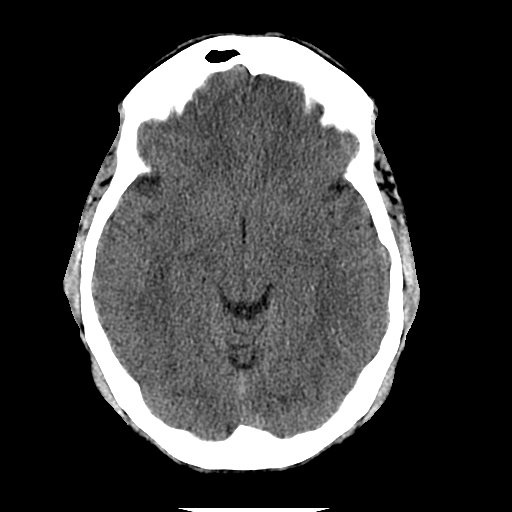
[im 11/29  bone]
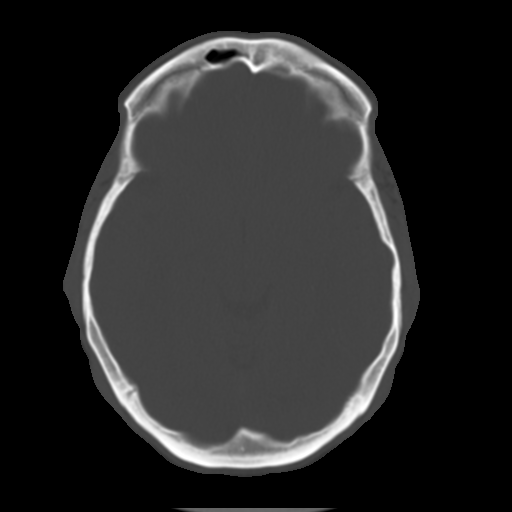
[im 13/29  brain]
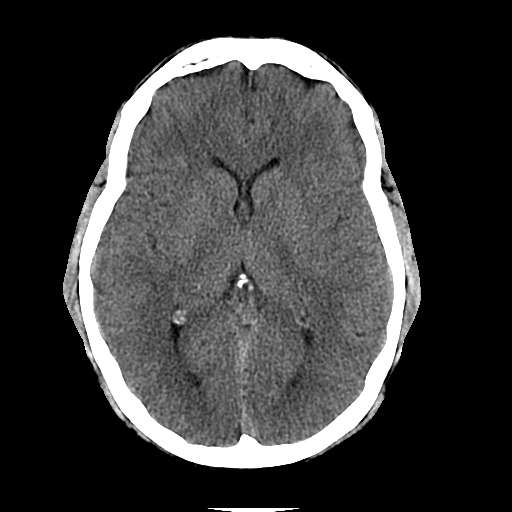
[im 17/29  brain]
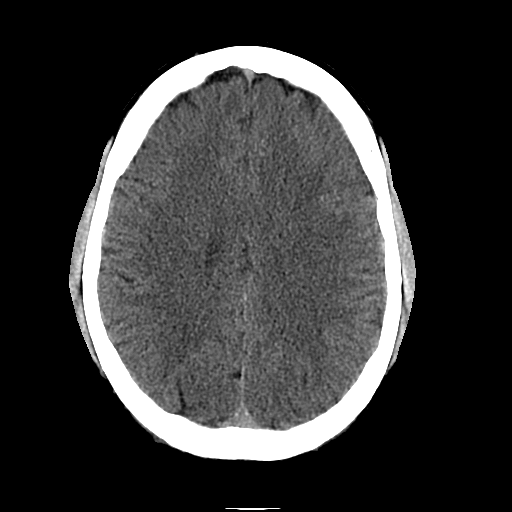
[im 19/29  brain]
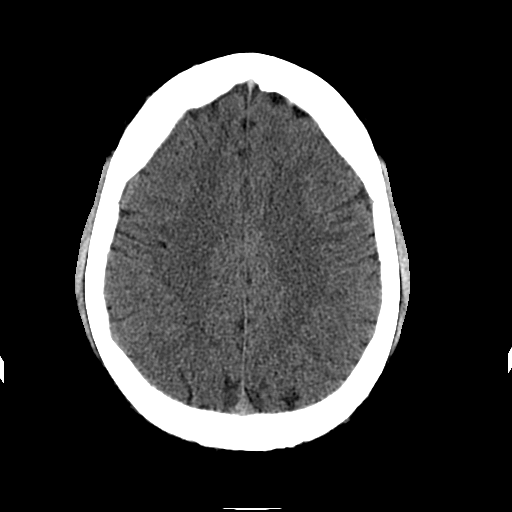
[im 21/29  brain]
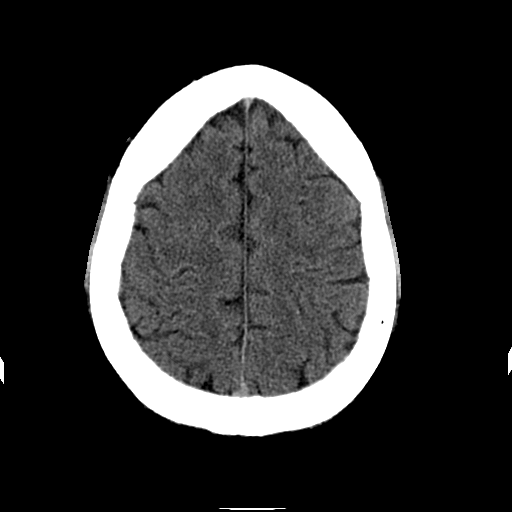
[im 21/29  bone]
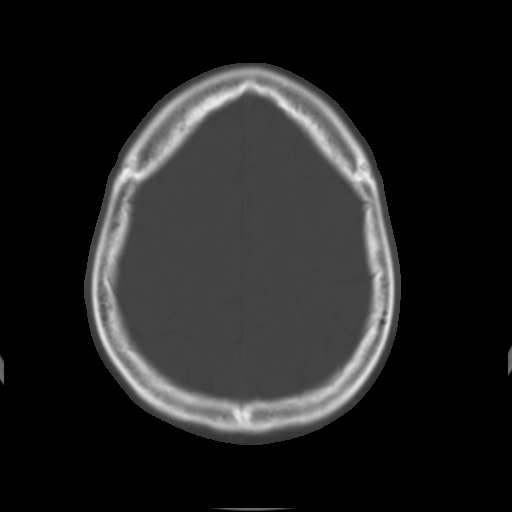
[im 23/29  brain]
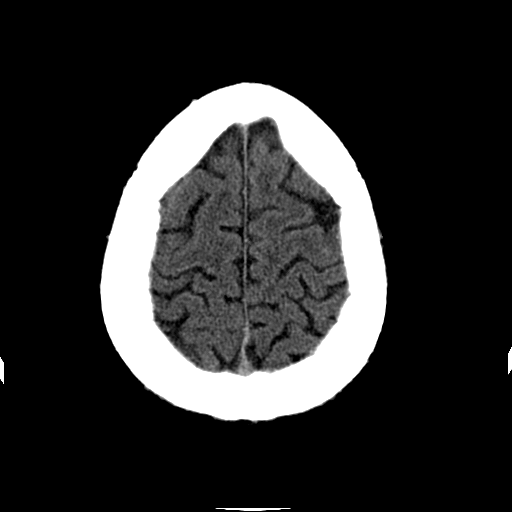
[im 25/29  brain]
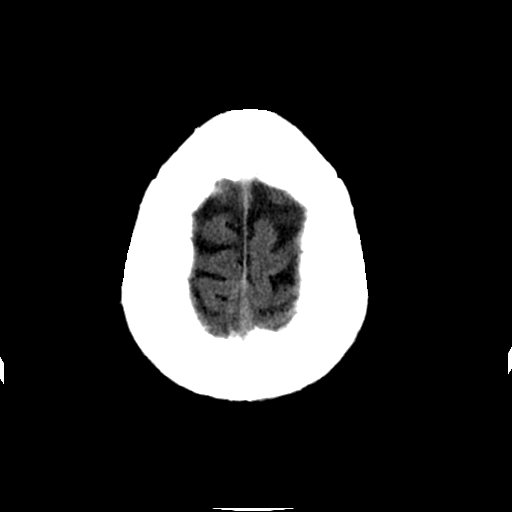
[im 27/29  brain]
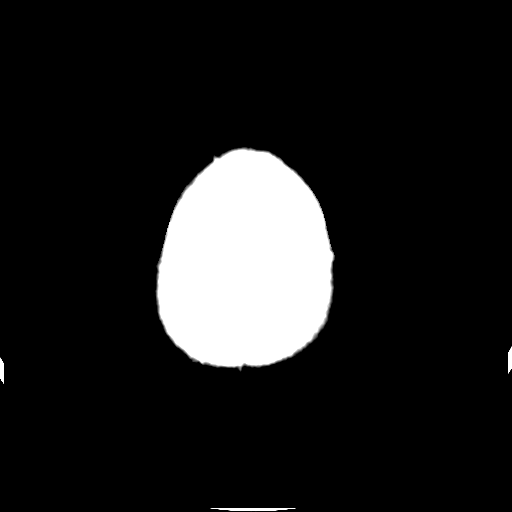

[Series 203: coronal st, idose (1) · coronal · 0.40mm/px · 3 of 69 slices shown]
[im 23/69  brain]
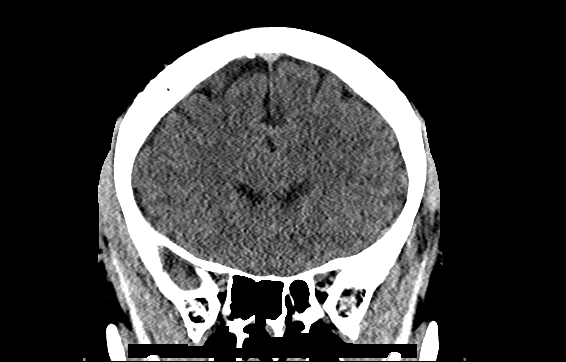
[im 31/69  brain]
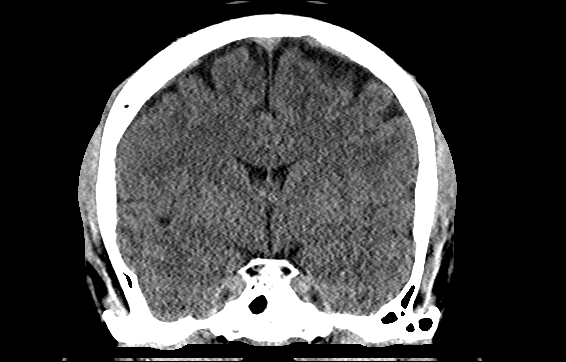
[im 38/69  brain]
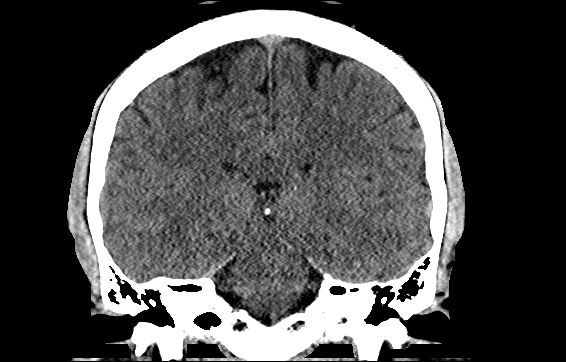

[Series 204: sagittal st, idose (1) · sagittal · 0.40mm/px · 3 of 58 slices shown]
[im 20/58  brain]
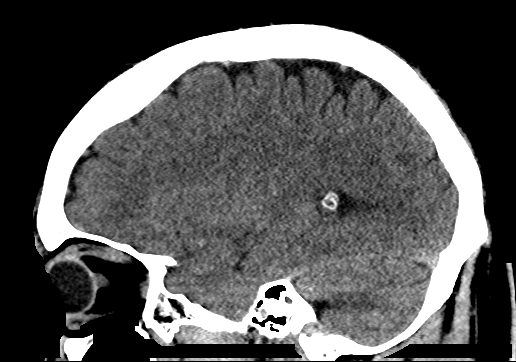
[im 29/58  brain]
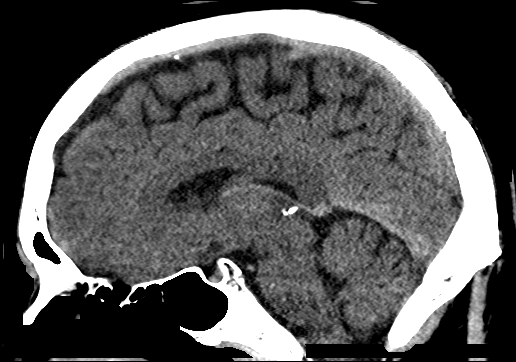
[im 39/58  brain]
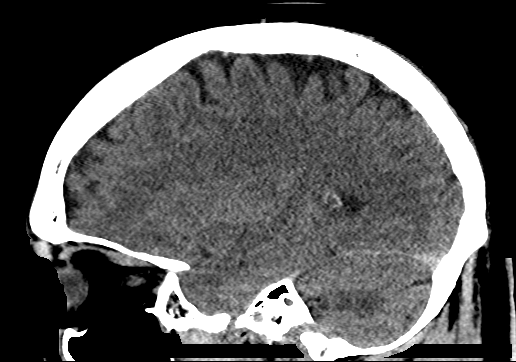

[18 of 47 positions shown; findings below may reference images not displayed]

FINDINGS: Brain: No acute infarct, hemorrhage, or mass lesion is present. The
ventricles are of normal size. No significant extraaxial fluid
collection is present. Brainstem and cerebellum are normal.

Vascular: No hyperdense vessel or unexpected calcification.

Skull: The calvarium is intact. No focal lytic or blastic lesions
are present.

Sinuses/Orbits: The paranasal sinuses and mastoid air cells are
clear. The globes and orbits are normal.
IMPRESSION: Negative CT of the head.

## 2018-05-14 MED FILL — TRUVADA 200-300 MG TABS: 200-300 | 30 days supply | Qty: 30 | Fill #1

## 2018-05-30 ENCOUNTER — Ambulatory Visit: Payer: Self-pay

## 2018-06-14 MED FILL — TRUVADA 200-300 MG TABS: 200-300 | 30 days supply | Qty: 30 | Fill #2

## 2018-07-02 ENCOUNTER — Ambulatory Visit (INDEPENDENT_AMBULATORY_CARE_PROVIDER_SITE_OTHER): Payer: Self-pay | Admitting: Pharmacist

## 2018-07-02 DIAGNOSIS — Z7251 High risk heterosexual behavior: Secondary | ICD-10-CM

## 2018-07-02 NOTE — Progress Notes (Signed)
Date:  07/02/2018   HPI: Harry Gonzales is a 47 y.o. male who presents to the [facility] pharmacy clinic for PrEP follow-up.  Insured   []    Uninsured  [x]    There are no active problems to display for this patient.   Patient's Medications  New Prescriptions   No medications on file  Previous Medications   EMTRICITABINE-TENOFOVIR (TRUVADA) 200-300 MG TABLET    Take 1 tablet by mouth daily.   FENOFIBRATE (TRICOR) 145 MG TABLET    Take 145 mg by mouth daily.   PRAVASTATIN (PRAVACHOL) 80 MG TABLET    Take 80 mg by mouth daily.  Modified Medications   No medications on file  Discontinued Medications   No medications on file    Allergies: Allergies  Allergen Reactions  . Codeine Nausea And Vomiting    Past Medical History: Past Medical History:  Diagnosis Date  . Hypercholesterolemia     Social History: Social History   Socioeconomic History  . Marital status: Divorced    Spouse name: Not on file  . Number of children: Not on file  . Years of education: Not on file  . Highest education level: Not on file  Occupational History  . Not on file  Social Needs  . Financial resource strain: Not on file  . Food insecurity:    Worry: Not on file    Inability: Not on file  . Transportation needs:    Medical: Not on file    Non-medical: Not on file  Tobacco Use  . Smoking status: Former Smoker    Last attempt to quit: 01/10/2016    Years since quitting: 2.4  . Smokeless tobacco: Never Used  Substance and Sexual Activity  . Alcohol use: Yes    Comment: several shots and several 24 oz beers per day  . Drug use: No  . Sexual activity: Yes  Lifestyle  . Physical activity:    Days per week: Not on file    Minutes per session: Not on file  . Stress: Not on file  Relationships  . Social connections:    Talks on phone: Not on file    Gets together: Not on file    Attends religious service: Not on file    Active member of club or organization: Not on file    Attends meetings  of clubs or organizations: Not on file    Relationship status: Not on file  Other Topics Concern  . Not on file  Social History Narrative  . Not on file    [facility] HIV PREP FLOWSHEET RESULTS 07/02/2018 07/02/2018 02/21/2018 11/24/2017  Insurance Status Uninsured Uninsured Uninsured Uninsured  How did you hear? - radio - [facility]  Gender at birth Male Male Male Male  Gender identity cis-Male - cis-Male cis-Male  Risk for HIV Condomless vaginal or anal intercourse;>5 partners in past 6 mos (regardless of condom use) - >5 partners in past 6 mos (regardless of condom use);Hx of STI >5 partners in past 6 mos (regardless of condom use);Condomless vaginal or anal intercourse  Sex Partners Women only - Women only Women only  # sex partners past 3-6 mos 4-6 - 1-3 4-6  Sex activity preferences Insertive - Insertive Insertive  Condom use Yes - No Yes  % condom use 45 - - 99  Treated for STI? No - No -  HIV symptoms? N/A - N/A N/A    Labs:  SCr: Lab Results  Component Value Date  CREATININE 0.98 02/21/2018   CREATININE 1.06 11/24/2017   CREATININE 1.10 02/23/2017   CREATININE 1.00 01/09/2017   CREATININE 1.09 01/09/2017   HIV Lab Results  Component Value Date   HIV NON-REACTIVE 02/21/2018   HIV NON-REACTIVE 11/24/2017   Hepatitis B Lab Results  Component Value Date   HEPBSAB REACTIVE (A) 11/24/2017   HEPBSAG NON-REACTIVE 11/24/2017   Hepatitis C No results found for: HEPCAB, HCVRNAPCRQN Hepatitis A No results found for: HAV RPR and STI Lab Results  Component Value Date   LABRPR NON-REACTIVE 11/24/2017    STI Results GC CT  11/24/2017 Negative Negative    Assessment: Harry Gonzales is here today to follow-up for PrEP.  He states he hasn't missed any doses and is having no issues except for some fatigue on Truvada. No signs/sx of STDs or HIV.  [facility] and [facility] for HIV and STDs.  He has had ~5-6 partners since I last saw him in May and is  no longer in a stable relationship.  He uses condoms most of the time, but states he did not with 2 partners. Cautioned about STD transmission with no condom use. He was just tested, so I will defer STD testing today and just check his kidney function (he prefers that I check his kidney function ever 3 months) and a HIV antibody.  Will send in refills if negative.  He remains in our uninsured PrEP cohort. He is looking into getting insurance.   Plan: - Continue Truvada PO once daily - HIV antibody and BMET today - 3 months of Truvada if negative - F/u with me 12/16 at 915am  Cassie L. Kuppelweiser, PharmD, AAHIVP, CPP Infectious Diseases Clinical Pharmacist [facility] 07/02/2018, 3:28 PM

## 2018-07-03 ENCOUNTER — Telehealth: Payer: Self-pay | Admitting: Pharmacist

## 2018-07-03 DIAGNOSIS — Z7251 High risk heterosexual behavior: Secondary | ICD-10-CM

## 2018-07-03 LAB — BASIC METABOLIC PANEL
BUN: 20 mg/dL (ref 7–25)
CO2: 26 mmol/L (ref 20–32)
CREATININE: 1.21 mg/dL (ref 0.60–1.35)
Calcium: 10.2 mg/dL (ref 8.6–10.3)
Chloride: 104 mmol/L (ref 98–110)
Glucose, Bld: 89 mg/dL (ref 65–99)
Potassium: 4.2 mmol/L (ref 3.5–5.3)
Sodium: 139 mmol/L (ref 135–146)

## 2018-07-03 LAB — HIV ANTIBODY (ROUTINE TESTING W REFLEX): HIV 1&2 Ab, 4th Generation: NONREACTIVE

## 2018-07-03 MED ORDER — EMTRICITABINE-TENOFOVIR DF 200-300 MG PO TABS: 1.0000 | ORAL_TABLET | Freq: Every day | ORAL | 2 refills | Status: DC

## 2018-07-03 NOTE — Telephone Encounter (Signed)
Called patient to let him know that his HIV antibody was negative.  Will send in refills of Truvada.

## 2018-07-03 NOTE — Addendum Note (Signed)
Addended by: Robinette HainesKUPPELWEISER, CASSIE L on: 07/03/2018 04:02 PM   Modules accepted: Level of Service

## 2018-07-19 MED FILL — TRUVADA 200-300 MG TABS: 200-300 | 30 days supply | Qty: 30 | Fill #0

## 2018-08-29 MED FILL — TRUVADA 200-300 MG TABS: 200-300 | 30 days supply | Qty: 30 | Fill #1

## 2018-09-27 MED FILL — TRUVADA 200-300 MG TABS: 200-300 | 30 days supply | Qty: 30 | Fill #2

## 2018-10-01 ENCOUNTER — Ambulatory Visit: Payer: Self-pay

## 2018-10-22 ENCOUNTER — Encounter (HOSPITAL_COMMUNITY): Payer: Self-pay | Admitting: Emergency Medicine

## 2018-10-22 ENCOUNTER — Emergency Department (HOSPITAL_COMMUNITY)
Admission: EM | Admit: 2018-10-22 | Discharge: 2018-10-22 | Disposition: A | Discharge status: Home / Self Care | Payer: BLUE CROSS/BLUE SHIELD | Attending: Emergency Medicine | Admitting: Emergency Medicine

## 2018-10-22 DIAGNOSIS — I10 Essential (primary) hypertension: Secondary | ICD-10-CM | POA: Diagnosis not present

## 2018-10-22 DIAGNOSIS — F17200 Nicotine dependence, unspecified, uncomplicated: Secondary | ICD-10-CM | POA: Diagnosis not present

## 2018-10-22 DIAGNOSIS — Z79899 Other long term (current) drug therapy: Secondary | ICD-10-CM | POA: Diagnosis not present

## 2018-10-22 MED ORDER — PRAVASTATIN SODIUM 40 MG PO TABS: 80.0000 mg | ORAL_TABLET | Freq: Every day | ORAL | 0 refills | Status: AC

## 2018-10-22 MED ORDER — EMTRICITABINE-TENOFOVIR DF 200-300 MG PO TABS: 1.0000 | ORAL_TABLET | Freq: Every day | ORAL | 0 refills | Status: DC

## 2018-10-22 MED ORDER — ACYCLOVIR 400 MG PO TABS: 400.0000 mg | ORAL_TABLET | Freq: Two times a day (BID) | ORAL | 0 refills | Status: AC

## 2018-10-22 MED ORDER — FENOFIBRATE 145 MG PO TABS: 145.0000 mg | ORAL_TABLET | Freq: Every day | ORAL | 0 refills | Status: AC

## 2018-10-22 NOTE — Discharge Instructions (Addendum)
Please return for any problem. Follow up with your regular provider as instructed.  °

## 2018-10-22 NOTE — ED Notes (Signed)
Pt verbalizes did not have insurance until the 1st; came here to get prescriptions refilled; requesting acyclovir and to speak with provider prior to discharge. Messick MD made aware and verbalizes will see pt.

## 2018-10-22 NOTE — ED Notes (Signed)
Discharge instructions reviewed with patient. Patient verbalizes understanding. VSS.   

## 2018-10-22 NOTE — ED Provider Notes (Signed)
[facility]-EMERGENCY DEPT Provider Note   CSN: [id number] Arrival date & time: 10/22/18  1104     History   Chief Complaint Chief Complaint  Patient presents with  . Hypertension    HPI Harry Gonzales is a 48 y.o. male.  48 year old male with prior medical history as detailed below presents for evaluation of reported elevated blood pressure.  Patient reports that he felt anxious and a little bit dizzy.  He went to the [facility] and have them check his blood pressure.  It reportedly was in the 170 systolic.  He was told to come to the ED for evaluation.  He denies prior history of hypertension.  He denies associated chest pain, shortness of breath, nausea, vomiting, headache, or other acute complaint.  He does feel improved upon his ED evaluation.  He does have established primary care  -it has been sometime since he has seen them.  He does request refill on his regular medications.  The history is provided by the patient and medical records.  Hypertension  This is a new problem. The current episode started 3 to 5 hours ago. The problem occurs rarely. The problem has not changed since onset.Pertinent negatives include no chest pain, no abdominal pain, no headaches and no shortness of breath. Nothing aggravates the symptoms. Nothing relieves the symptoms. He has tried nothing for the symptoms.    Past Medical History:  Diagnosis Date  . Hypercholesterolemia     There are no active problems to display for this patient.   History reviewed. No pertinent surgical history.      Home Medications    Prior to Admission medications   Medication Sig Start Date End Date Taking? Authorizing Provider  emtricitabine-tenofovir (TRUVADA) 200-300 MG tablet Take 1 tablet by mouth daily. 07/03/18   Kuppelweiser, Cassie L, RPH-CPP  emtricitabine-tenofovir (TRUVADA) 200-300 MG tablet Take 1 tablet by mouth daily. 10/22/18 11/21/18  Wynetta Fines, MD  fenofibrate (TRICOR) 145 MG  tablet Take 145 mg by mouth daily.    [provider]  fenofibrate (TRICOR) 145 MG tablet Take 1 tablet (145 mg total) by mouth daily. 10/22/18 11/21/18  Wynetta Fines, MD  pravastatin (PRAVACHOL) 40 MG tablet Take 2 tablets (80 mg total) by mouth daily. 10/22/18 11/21/18  Wynetta Fines, MD  pravastatin (PRAVACHOL) 80 MG tablet Take 80 mg by mouth daily.    [provider]    Family History No family history on file.  Social History Social History   Tobacco Use  . Smoking status: Light Tobacco Smoker  . Smokeless tobacco: Never Used  Substance Use Topics  . Alcohol use: Yes    Comment: several shots and several 24 oz beers per day  . Drug use: No     Allergies   Codeine   Review of Systems Review of Systems  Respiratory: Negative for shortness of breath.   Cardiovascular: Negative for chest pain.  Gastrointestinal: Negative for abdominal pain.  Neurological: Negative for headaches.  All other systems reviewed and are negative.    Physical Exam Updated Vital Signs BP 130/86   Pulse 82   Temp 98.7 F (37.1 C) (Oral)   Resp 14   Ht 5\' 10"  (1.778 m)   Wt 92.5 kg   SpO2 100%   BMI 29.27 kg/m   Physical Exam Vitals signs and nursing note reviewed.  Constitutional:      General: He is not in acute distress.    Appearance: He is  well-developed.  HENT:     Head: Normocephalic and atraumatic.  Eyes:     Conjunctiva/sclera: Conjunctivae normal.     Pupils: Pupils are equal, round, and reactive to light.  Neck:     Musculoskeletal: Normal range of motion and neck supple.  Cardiovascular:     Rate and Rhythm: Normal rate and regular rhythm.     Heart sounds: Normal heart sounds.  Pulmonary:     Effort: Pulmonary effort is normal. No respiratory distress.     Breath sounds: Normal breath sounds.  Abdominal:     General: There is no distension.     Palpations: Abdomen is soft.     Tenderness: There is no abdominal tenderness.  Musculoskeletal:  Normal range of motion.        General: No deformity.  Skin:    General: Skin is warm and dry.  Neurological:     Mental Status: He is alert and oriented to person, place, and time.      ED Treatments / Results  Labs (all labs ordered are listed, but only abnormal results are displayed) Labs Reviewed - No data to display  EKG None  Radiology No results found.  Procedures Procedures (including critical care time)  Medications Ordered in ED Medications - No data to display   Initial Impression / Assessment and Plan / ED Course  I have reviewed the triage vital signs and the nursing notes.  Pertinent labs & imaging results that were available during my care of the patient were reviewed by me and considered in my medical decision making (see chart for details).     MDM  Screen complete  Patient is presenting for evaluation of reported hypertension.  Patient is without symptoms related to his hypertension.  He does have established primary care.  He understands the need for close follow-up.  Patient did request refill on his routine outpatient medications.  This was provided.  Importance of close follow-up is stressed.  Strict precautions given and understood.  Final Clinical Impressions(s) / ED Diagnoses   Final diagnoses:  Hypertension, unspecified type    ED Discharge Orders         Ordered    pravastatin (PRAVACHOL) 40 MG tablet  Daily     10/22/18 1248    fenofibrate (TRICOR) 145 MG tablet  Daily     10/22/18 1248    emtricitabine-tenofovir (TRUVADA) 200-300 MG tablet  Daily     10/22/18 1248           Wynetta Fines, MD 10/22/18 1337

## 2018-10-22 NOTE — ED Triage Notes (Signed)
Pt reports wasn't feeling well this morning and felt anxious. Reports had high reading at when checked it at drug store and was advised to go to ED. Reports had pressure in eyes for week. No Hx HTN.

## 2018-11-07 ENCOUNTER — Ambulatory Visit (INDEPENDENT_AMBULATORY_CARE_PROVIDER_SITE_OTHER): Payer: BLUE CROSS/BLUE SHIELD | Admitting: Pharmacist

## 2018-11-07 ENCOUNTER — Other Ambulatory Visit (HOSPITAL_COMMUNITY)
Admission: RE | Admit: 2018-11-07 | Discharge: 2018-11-07 | Disposition: A | Discharge status: Home / Self Care | Payer: BLUE CROSS/BLUE SHIELD | Source: Ambulatory Visit | Attending: Infectious Disease | Admitting: Infectious Disease

## 2018-11-07 DIAGNOSIS — Z7251 High risk heterosexual behavior: Secondary | ICD-10-CM | POA: Diagnosis not present

## 2018-11-07 NOTE — Progress Notes (Signed)
Date:  11/07/2018   HPI: Harry Gonzales is a 48 y.o. male presenting to [facility] clinic for 3 month PrEP follow-up.  Insured   [x]    Uninsured  []    There are no active problems to display for this patient.   Patient's Medications  New Prescriptions   No medications on file  Previous Medications   ACYCLOVIR (ZOVIRAX) 400 MG TABLET    Take 1 tablet (400 mg total) by mouth 2 (two) times daily.   EMTRICITABINE-TENOFOVIR (TRUVADA) 200-300 MG TABLET    Take 1 tablet by mouth daily.   EMTRICITABINE-TENOFOVIR (TRUVADA) 200-300 MG TABLET    Take 1 tablet by mouth daily.   FENOFIBRATE (TRICOR) 145 MG TABLET    Take 145 mg by mouth daily.   FENOFIBRATE (TRICOR) 145 MG TABLET    Take 1 tablet (145 mg total) by mouth daily.   PRAVASTATIN (PRAVACHOL) 40 MG TABLET    Take 2 tablets (80 mg total) by mouth daily.   PRAVASTATIN (PRAVACHOL) 80 MG TABLET    Take 80 mg by mouth daily.  Modified Medications   No medications on file  Discontinued Medications   No medications on file    Allergies: Allergies  Allergen Reactions  . Codeine Nausea And Vomiting    Past Medical History: Past Medical History:  Diagnosis Date  . Hypercholesterolemia     Social History: Social History   Socioeconomic History  . Marital status: Divorced    Spouse name: Not on file  . Number of children: Not on file  . Years of education: Not on file  . Highest education level: Not on file  Occupational History  . Not on file  Social Needs  . Financial resource strain: Not on file  . Food insecurity:    Worry: Not on file    Inability: Not on file  . Transportation needs:    Medical: Not on file    Non-medical: Not on file  Tobacco Use  . Smoking status: Light Tobacco Smoker  . Smokeless tobacco: Never Used  Substance and Sexual Activity  . Alcohol use: Yes    Comment: several shots and several 24 oz beers per day  . Drug use: No  . Sexual activity: Yes  Lifestyle  . Physical activity:    Days per week:  Not on file    Minutes per session: Not on file  . Stress: Not on file  Relationships  . Social connections:    Talks on phone: Not on file    Gets together: Not on file    Attends religious service: Not on file    Active member of club or organization: Not on file    Attends meetings of clubs or organizations: Not on file    Relationship status: Not on file  Other Topics Concern  . Not on file  Social History Narrative  . Not on file    [facility] HIV PREP FLOWSHEET RESULTS 11/07/2018 07/02/2018 07/02/2018 02/21/2018 11/24/2017  Insurance Status Insured Uninsured Uninsured Uninsured Uninsured  How did you hear? - - radio - [facility]  Gender at birth Male Male Male Male Male  Gender identity cis-Male cis-Male - cis-Male cis-Male  Risk for HIV Condomless vaginal or anal intercourse;>5 partners in past 6 mos (regardless of condom use) Condomless vaginal or anal intercourse;>5 partners in past 6 mos (regardless of condom use) - >5 partners in past 6 mos (regardless of condom use);Hx of STI >5 partners in past 6 mos (regardless  of condom use);Condomless vaginal or anal intercourse  Sex Partners Women only Women only - Women only Women only  # sex partners past 3-6 mos 4-6 4-6 - 1-3 4-6  Sex activity preferences Insertive Insertive - Insertive Insertive  Condom use Yes Yes - No Yes  % condom use 70 45 - - 99  Treated for STI? No No - No -  HIV symptoms? N/A N/A - N/A N/A  PrEP Eligibility HIV negative;CrCl >60 ml/min - - - -    Labs:  SCr: Lab Results  Component Value Date   CREATININE 1.21 07/02/2018   CREATININE 0.98 02/21/2018   CREATININE 1.06 11/24/2017   CREATININE 1.10 02/23/2017   CREATININE 1.00 01/09/2017   HIV Lab Results  Component Value Date   HIV NON-REACTIVE 07/02/2018   HIV NON-REACTIVE 02/21/2018   HIV NON-REACTIVE 11/24/2017   Hepatitis B Lab Results  Component Value Date   HEPBSAB REACTIVE (A) 11/24/2017   HEPBSAG NON-REACTIVE 11/24/2017   Hepatitis  C No results found for: HEPCAB, HCVRNAPCRQN Hepatitis A No results found for: HAV RPR and STI Lab Results  Component Value Date   LABRPR NON-REACTIVE 11/24/2017    STI Results GC CT  11/24/2017 Negative Negative    Assessment: Harry Gonzales is here today for a 34-month PrEP follow-up. He states he has missed a few doses of Truvada as he ran out of refills. He reports no issues with the medication. Discussed switching to Descovy today, which Harry Gonzales is happy to hear that there are less associated long-term effects on his kidneys. Discussed taking Descovy the same way as Truvada, one pill once daily. He reports that he has had about 5 male partners since we last saw him in September, none of which are HIV positive. He reports using condoms about 70% of the time. He does inquire whether or not his partners have been recently tested for STDs but states that one hasn't been tested in years. [facility] but doesn't remember if he has been there since his last visit with Harry Gonzales. Will order HIV antibody today as well as RPR and urine cytologies. He is due for a BMET today but he would like to have his liver checked as well, so will order a CMET.  He just recently got insurance that became active the first of this year. Harry Gonzales ran a test claim with his new insurance information and will get a copay card to cover the remainder of his copay. He gets his medications mailed through [facility]. Will call tomorrow with his lab results and send 3 months of Descovy to be mailed to him. He has no other questions or concerns at this time. Gave him Cassie's card and encouraged him to call should he think of anything.   Plan: -HIV antibody, CMET -Urine cytology, RPR -Descovy x3 months if negative -Follow-up 3 months with Cassie on 4/15 at 10:45  [facility] Student [facility] 11/07/2018, 12:11 PM

## 2018-11-08 ENCOUNTER — Telehealth: Payer: Self-pay

## 2018-11-08 DIAGNOSIS — Z7251 High risk heterosexual behavior: Secondary | ICD-10-CM

## 2018-11-08 LAB — URINE CYTOLOGY ANCILLARY ONLY
Chlamydia: NEGATIVE
Neisseria Gonorrhea: NEGATIVE

## 2018-11-08 LAB — RPR: RPR Ser Ql: NONREACTIVE

## 2018-11-08 LAB — COMPREHENSIVE METABOLIC PANEL
AG Ratio: 1.7 (calc) (ref 1.0–2.5)
ALT: 22 U/L (ref 9–46)
AST: 20 U/L (ref 10–40)
Albumin: 4.7 g/dL (ref 3.6–5.1)
Alkaline phosphatase (APISO): 37 U/L — ABNORMAL LOW (ref 40–115)
BUN/Creatinine Ratio: 13 (calc) (ref 6–22)
BUN: 18 mg/dL (ref 7–25)
CO2: 27 mmol/L (ref 20–32)
Calcium: 9.7 mg/dL (ref 8.6–10.3)
Chloride: 103 mmol/L (ref 98–110)
Creat: 1.44 mg/dL — ABNORMAL HIGH (ref 0.60–1.35)
Globulin: 2.8 g/dL (calc) (ref 1.9–3.7)
Glucose, Bld: 90 mg/dL (ref 65–99)
Potassium: 4.2 mmol/L (ref 3.5–5.3)
Sodium: 138 mmol/L (ref 135–146)
Total Bilirubin: 0.5 mg/dL (ref 0.2–1.2)
Total Protein: 7.5 g/dL (ref 6.1–8.1)

## 2018-11-08 LAB — HIV ANTIBODY (ROUTINE TESTING W REFLEX): HIV 1&2 Ab, 4th Generation: NONREACTIVE

## 2018-11-08 MED ORDER — EMTRICITABINE-TENOFOVIR AF 200-25 MG PO TABS: 1.0000 | ORAL_TABLET | Freq: Every day | ORAL | 2 refills | Status: DC

## 2018-11-08 NOTE — Telephone Encounter (Signed)
Called and spoke with Harry Gonzales to tell him his HIV test was negative and we will send in Descovy x3 months to [facility] to be mailed out tomorrow. Also informed him that his liver function tests were normal and his serum creatinine was a slightly elevated from last visit but we will continue to monitor.

## 2018-11-08 NOTE — Telephone Encounter (Signed)
Agree with  Harry Gonzales's assessment. Will send in refills to [facility].  Descovy copay card information:  ID: [id number] BIN: [id number] GRP: [id number] PCN: [id number]

## 2018-11-09 MED FILL — DESCOVY 200-25 MG TABS: 200-25 | 30 days supply | Qty: 30 | Fill #0

## 2018-12-24 MED FILL — DESCOVY 200-25 MG TABS: 200-25 | 30 days supply | Qty: 30 | Fill #1

## 2019-01-07 ENCOUNTER — Emergency Department (HOSPITAL_COMMUNITY)
Admission: EM | Admit: 2019-01-07 | Discharge: 2019-01-07 | Disposition: A | Discharge status: Home / Self Care | Payer: BLUE CROSS/BLUE SHIELD | Attending: Emergency Medicine | Admitting: Emergency Medicine

## 2019-01-07 ENCOUNTER — Encounter (HOSPITAL_COMMUNITY): Payer: Self-pay | Admitting: *Deleted

## 2019-01-07 DIAGNOSIS — R05 Cough: Secondary | ICD-10-CM | POA: Diagnosis not present

## 2019-01-07 DIAGNOSIS — F17210 Nicotine dependence, cigarettes, uncomplicated: Secondary | ICD-10-CM | POA: Diagnosis not present

## 2019-01-07 DIAGNOSIS — Z20828 Contact with and (suspected) exposure to other viral communicable diseases: Secondary | ICD-10-CM | POA: Diagnosis not present

## 2019-01-07 DIAGNOSIS — R6889 Other general symptoms and signs: Secondary | ICD-10-CM

## 2019-01-07 DIAGNOSIS — R509 Fever, unspecified: Secondary | ICD-10-CM | POA: Diagnosis not present

## 2019-01-07 DIAGNOSIS — Z79899 Other long term (current) drug therapy: Secondary | ICD-10-CM | POA: Insufficient documentation

## 2019-01-07 LAB — RESPIRATORY PANEL BY PCR
Adenovirus: NOT DETECTED
Bordetella pertussis: NOT DETECTED
Chlamydophila pneumoniae: NOT DETECTED
Coronavirus 229E: NOT DETECTED
Coronavirus HKU1: NOT DETECTED
Coronavirus NL63: NOT DETECTED
Coronavirus OC43: NOT DETECTED
INFLUENZA A-RVPPCR: NOT DETECTED
Influenza B: NOT DETECTED
MYCOPLASMA PNEUMONIAE-RVPPCR: NOT DETECTED
Metapneumovirus: NOT DETECTED
Parainfluenza Virus 1: NOT DETECTED
Parainfluenza Virus 2: NOT DETECTED
Parainfluenza Virus 3: NOT DETECTED
Parainfluenza Virus 4: NOT DETECTED
Respiratory Syncytial Virus: NOT DETECTED
Rhinovirus / Enterovirus: NOT DETECTED

## 2019-01-07 NOTE — ED Provider Notes (Signed)
[facility] EMERGENCY DEPARTMENT Provider Note   CSN: [id number] Arrival date & time: 01/07/19  1418    History   Chief Complaint Chief Complaint  Patient presents with  . Chills    HPI Harry Gonzales is a 48 y.o. male.     Patient with history of cholesterolemia presents with chills mild cough and sick contact.  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  That individual has had fever cough congestion for the past few days.  Individual was refusing testing due to behavioral/psychiatric history and decision was made to test Harry Gonzales as it would help the other situation and he also has mild symptoms.     Past Medical History:  Diagnosis Date  . Hypercholesterolemia     There are no active problems to display for this patient.   History reviewed. No pertinent surgical history.      Home Medications    Prior to Admission medications   Medication Sig Start Date End Date Taking? Authorizing Provider  acyclovir (ZOVIRAX) 400 MG tablet Take 1 tablet (400 mg total) by mouth 2 (two) times daily. 10/22/18   Wynetta Fines, MD  emtricitabine-tenofovir AF (DESCOVY) 200-25 MG tablet Take 1 tablet by mouth daily. 11/08/18   Kuppelweiser, Cassie L, RPH-CPP  fenofibrate (TRICOR) 145 MG tablet Take 1 tablet (145 mg total) by mouth daily. 10/22/18 11/21/18  Wynetta Fines, MD  pravastatin (PRAVACHOL) 40 MG tablet Take 2 tablets (80 mg total) by mouth daily. 10/22/18 11/21/18  Wynetta Fines, MD    Family History No family history on file.  Social History Social History   Tobacco Use  . Smoking status: Light Tobacco Smoker  . Smokeless tobacco: Never Used  Substance Use Topics  . Alcohol use: Yes    Comment: several shots and several 24 oz beers per day  . Drug use: No     Allergies   Codeine   Review of Systems Review of Systems  Constitutional: Positive for chills. Negative for fever.   HENT: Negative for congestion.   Eyes: Negative for visual disturbance.  Respiratory: Positive for cough. Negative for shortness of breath.   Cardiovascular: Negative for chest pain.  Gastrointestinal: Negative for abdominal pain and vomiting.  Genitourinary: Negative for dysuria and flank pain.  Musculoskeletal: Negative for back pain, neck pain and neck stiffness.  Skin: Negative for rash.  Neurological: Negative for light-headedness and headaches.     Physical Exam Updated Vital Signs BP (!) 153/87 (BP Location: Right Arm)   Pulse 96   Temp 98.4 F (36.9 C) (Oral)   Resp 16   SpO2 98%   Physical Exam Vitals signs and nursing note reviewed.  Constitutional:      Appearance: He is well-developed.  HENT:     Head: Normocephalic and atraumatic.  Eyes:     General:        Right eye: No discharge.        Left eye: No discharge.     Conjunctiva/sclera: Conjunctivae normal.  Neck:     Musculoskeletal: Normal range of motion.     Trachea: No tracheal deviation.  Cardiovascular:     Rate and Rhythm: Normal rate.  Pulmonary:     Effort: Pulmonary effort is normal.     Breath sounds: Normal breath sounds.  Abdominal:     General: There is no distension.  Skin:    General:  Skin is warm.     Findings: No rash.  Neurological:     Mental Status: He is alert and oriented to person, place, and time.      ED Treatments / Results  Labs (all labs ordered are listed, but only abnormal results are displayed) Labs Reviewed  NOVEL CORONAVIRUS, NAA (HOSPITAL ORDER, SEND-OUT TO REF LAB)  RESPIRATORY PANEL BY PCR    EKG None  Radiology No results found.  Procedures Procedures (including critical care time)  Medications Ordered in ED Medications - No data to display   Initial Impression / Assessment and Plan / ED Course  I have reviewed the triage vital signs and the nursing notes.  Pertinent labs & imaging results that were available during my care of the patient  were reviewed by me and considered in my medical decision making (see chart for details).       Patient presents with mild flulike symptoms with chills and cough.  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  Decision to test for viral panel and coag with close outpatient follow-up and isolation discussed.  Final Clinical Impressions(s) / ED Diagnoses   Final diagnoses:  Flu-like symptoms    ED Discharge Orders    None       Blane Ohara, MD 01/07/19 [phone]

## 2019-01-07 NOTE — Discharge Instructions (Addendum)
Follow up as discussed, phone numbers below. Your COVID test should be back 3 to 4 days. Isolate at home as discussed.  If positive the group day care needs to be aware for all of their participants.    Person Under Monitoring Name: Harry Gonzales  Location: [address] [place] [address]   Infection Prevention Recommendations for Individuals Confirmed to have, or Being Evaluated for, 2019 Novel Coronavirus (COVID-19) Infection Who Receive Care at Home  Individuals who are confirmed to have, or are being evaluated for, COVID-19 should follow the prevention steps below until a healthcare provider or local or state health department says they can return to normal activities.  Stay home except to get medical care You should restrict activities outside your home, except for getting medical care. Do not go to work, school, or public areas, and do not use public transportation or taxis.  Call ahead before visiting your doctor Before your medical appointment, call the healthcare provider and tell them that you have, or are being evaluated for, COVID-19 infection. This will help the healthcare providers office take steps to keep other people from getting infected. Ask your healthcare provider to call the local or state health department.  Monitor your symptoms Seek prompt medical attention if your illness is worsening (e.g., difficulty breathing). Before going to your medical appointment, call the healthcare provider and tell them that you have, or are being evaluated for, COVID-19 infection. Ask your healthcare provider to call the local or state health department.  Wear a facemask You should wear a facemask that covers your nose and mouth when you are in the same room with other people and when you visit a healthcare provider. People who live with or visit you should also wear a facemask while they are in the same room with you.  Separate yourself from other people in your  home As much as possible, you should stay in a different room from other people in your home. Also, you should use a separate bathroom, if available.  Avoid sharing household items You should not share dishes, drinking glasses, cups, eating utensils, towels, bedding, or other items with other people in your home. After using these items, you should wash them thoroughly with soap and water.  Cover your coughs and sneezes Cover your mouth and nose with a tissue when you cough or sneeze, or you can cough or sneeze into your sleeve. Throw used tissues in a lined trash can, and immediately wash your hands with soap and water for at least 20 seconds or use an alcohol-based hand rub.  Wash your [employer] your hands often and thoroughly with soap and water for at least 20 seconds. You can use an alcohol-based hand sanitizer if soap and water are not available and if your hands are not visibly dirty. Avoid touching your eyes, nose, and mouth with unwashed hands.   Prevention Steps for Caregivers and Household Members of Individuals Confirmed to have, or Being Evaluated for, COVID-19 Infection Being Cared for in the Home  If you live with, or provide care at home for, a person confirmed to have, or being evaluated for, COVID-19 infection please follow these guidelines to prevent infection:  Follow healthcare providers instructions Make sure that you understand and can help the patient follow any healthcare provider instructions for all care.  Provide for the patients basic needs You should help the patient with basic needs in the home and provide support for getting groceries, prescriptions, and  other personal needs.  Monitor the patients symptoms If they are getting sicker, call his or her medical provider and tell them that the patient has, or is being evaluated for, COVID-19 infection. This will help the healthcare providers office take steps to keep other people from getting  infected. Ask the healthcare provider to call the local or state health department.  Limit the number of people who have contact with the patient If possible, have only one caregiver for the patient. Other household members should stay in another home or place of residence. If this is not possible, they should stay in another room, or be separated from the patient as much as possible. Use a separate bathroom, if available. Restrict visitors who do not have an essential need to be in the home.  Keep older adults, very young children, and other sick people away from the patient Keep older adults, very young children, and those who have compromised immune systems or chronic health conditions away from the patient. This includes people with chronic heart, lung, or kidney conditions, diabetes, and cancer.  Ensure good ventilation Make sure that shared spaces in the home have good air flow, such as from an air conditioner or an opened window, weather permitting.  Wash your hands often Wash your hands often and thoroughly with soap and water for at least 20 seconds. You can use an alcohol based hand sanitizer if soap and water are not available and if your hands are not visibly dirty. Avoid touching your eyes, nose, and mouth with unwashed hands. Use disposable paper towels to dry your hands. If not available, use dedicated cloth towels and replace them when they become wet.  Wear a facemask and gloves Wear a disposable facemask at all times in the room and gloves when you touch or have contact with the patients blood, body fluids, and/or secretions or excretions, such as sweat, saliva, sputum, nasal mucus, vomit, urine, or feces.  Ensure the mask fits over your nose and mouth tightly, and do not touch it during use. Throw out disposable facemasks and gloves after using them. Do not reuse. Wash your hands immediately after removing your facemask and gloves. If your personal clothing becomes  contaminated, carefully remove clothing and launder. Wash your hands after handling contaminated clothing. Place all used disposable facemasks, gloves, and other waste in a lined container before disposing them with other household waste. Remove gloves and wash your hands immediately after handling these items.  Do not share dishes, glasses, or other household items with the patient Avoid sharing household items. You should not share dishes, drinking glasses, cups, eating utensils, towels, bedding, or other items with a patient who is confirmed to have, or being evaluated for, COVID-19 infection. After the person uses these items, you should wash them thoroughly with soap and water.  Wash laundry thoroughly Immediately remove and wash clothes or bedding that have blood, body fluids, and/or secretions or excretions, such as sweat, saliva, sputum, nasal mucus, vomit, urine, or feces, on them. Wear gloves when handling laundry from the patient. Read and follow directions on labels of laundry or clothing items and detergent. In general, wash and dry with the warmest temperatures recommended on the label.  Clean all areas the individual has used often Clean all touchable surfaces, such as counters, tabletops, doorknobs, bathroom fixtures, toilets, phones, keyboards, tablets, and bedside tables, every day. Also, clean any surfaces that may have blood, body fluids, and/or secretions or excretions on them. Wear gloves when cleaning  surfaces the patient has come in contact with. Use a diluted bleach solution (e.g., dilute bleach with 1 part bleach and 10 parts water) or a household disinfectant with a label that says EPA-registered for coronaviruses. To make a bleach solution at home, add 1 tablespoon of bleach to 1 quart (4 cups) of water. For a larger supply, add  cup of bleach to 1 gallon (16 cups) of water. Read labels of cleaning products and follow recommendations provided on product labels. Labels  contain instructions for safe and effective use of the cleaning product including precautions you should take when applying the product, such as wearing gloves or eye protection and making sure you have good ventilation during use of the product. Remove gloves and wash hands immediately after cleaning.  Monitor yourself for signs and symptoms of illness Caregivers and household members are considered close contacts, should monitor their health, and will be asked to limit movement outside of the home to the extent possible. Follow the monitoring steps for close contacts listed on the symptom monitoring form.   ? If you have additional questions, contact your local health department or call the epidemiologist on call at [phone] (available 24/7). ? This guidance is subject to change. For the most up-to-date guidance from [facility], please refer to their website: YouBlogs.pl

## 2019-01-14 MED FILL — DESCOVY 200-25 MG TABS: 200-25 | 30 days supply | Qty: 30 | Fill #2

## 2019-01-15 LAB — NOVEL CORONAVIRUS, NAA (HOSP ORDER, SEND-OUT TO REF LAB; TAT 18-24 HRS): SARS-CoV-2, NAA: NOT DETECTED

## 2019-01-30 ENCOUNTER — Ambulatory Visit: Payer: BLUE CROSS/BLUE SHIELD | Admitting: Pharmacist

## 2019-01-30 NOTE — Addendum Note (Signed)
Addended by: Robinette Haines on: 01/30/2019 02:28 PM   Modules accepted: Level of Service

## 2019-02-19 ENCOUNTER — Ambulatory Visit: Payer: BLUE CROSS/BLUE SHIELD | Admitting: Pharmacist

## 2019-02-19 ENCOUNTER — Other Ambulatory Visit: Payer: Self-pay | Admitting: Pharmacist

## 2019-02-19 DIAGNOSIS — Z7251 High risk heterosexual behavior: Secondary | ICD-10-CM

## 2019-02-19 MED ORDER — EMTRICITABINE-TENOFOVIR AF 200-25 MG PO TABS: 1.0000 | ORAL_TABLET | Freq: Every day | ORAL | 0 refills | Status: DC

## 2019-02-19 MED FILL — DESCOVY 200-25 MG TABS: 200-25 | 30 days supply | Qty: 30 | Fill #0

## 2019-02-19 NOTE — Progress Notes (Signed)
Patient did not feel comfortable coming in for an appointment today due to the COVID 19 pandemic. He rescheduled for next month. I will send in a 30 day supply for him to last him until next month.

## 2019-03-21 ENCOUNTER — Telehealth: Payer: Self-pay | Admitting: Pharmacist

## 2019-03-21 NOTE — Telephone Encounter (Signed)
COVID-19 Pre-Screening Questions: ° °Do you currently have a fever (>100 °F), chills or unexplained body aches?no   ° °Are you currently experiencing new cough, shortness of breath, sore throat, runny nose? No   °•  °Have you recently travelled outside the state of [place] in the last 14 days? No  °•  °1. Have you been in contact with someone that is currently pending confirmation of Covid19 testing or has been confirmed to have the Covid19 virus?  No  ° °

## 2019-03-25 ENCOUNTER — Telehealth: Payer: Self-pay | Admitting: Pharmacy Technician

## 2019-03-25 ENCOUNTER — Ambulatory Visit (INDEPENDENT_AMBULATORY_CARE_PROVIDER_SITE_OTHER): Payer: BLUE CROSS/BLUE SHIELD | Admitting: Pharmacist

## 2019-03-25 ENCOUNTER — Other Ambulatory Visit: Payer: Self-pay

## 2019-03-25 DIAGNOSIS — Z7251 High risk heterosexual behavior: Secondary | ICD-10-CM

## 2019-03-25 NOTE — Progress Notes (Signed)
Date:  03/26/2019   HPI: Harry Gonzales is a 48 y.o. male who presents to the [facility] clinic for 3 month PrEP follow-up.  Insured   [x]    Uninsured  []    There are no active problems to display for this patient.   Patient's Medications  New Prescriptions   No medications on file  Previous Medications   ACYCLOVIR (ZOVIRAX) 400 MG TABLET    Take 1 tablet (400 mg total) by mouth 2 (two) times daily.   FENOFIBRATE (TRICOR) 145 MG TABLET    Take 1 tablet (145 mg total) by mouth daily.   PRAVASTATIN (PRAVACHOL) 40 MG TABLET    Take 2 tablets (80 mg total) by mouth daily.  Modified Medications   Modified Medication Previous Medication   EMTRICITABINE-TENOFOVIR AF (DESCOVY) 200-25 MG TABLET emtricitabine-tenofovir AF (DESCOVY) 200-25 MG tablet      Take 1 tablet by mouth daily.    Take 1 tablet by mouth daily.  Discontinued Medications   No medications on file    Allergies: Allergies  Allergen Reactions  . Codeine Nausea And Vomiting    Past Medical History: Past Medical History:  Diagnosis Date  . Hypercholesterolemia     Social History: Social History   Socioeconomic History  . Marital status: Divorced    Spouse name: Not on file  . Number of children: Not on file  . Years of education: Not on file  . Highest education level: Not on file  Occupational History  . Not on file  Social Needs  . Financial resource strain: Not on file  . Food insecurity:    Worry: Not on file    Inability: Not on file  . Transportation needs:    Medical: Not on file    Non-medical: Not on file  Tobacco Use  . Smoking status: Light Tobacco Smoker  . Smokeless tobacco: Never Used  Substance and Sexual Activity  . Alcohol use: Yes    Comment: several shots and several 24 oz beers per day  . Drug use: No  . Sexual activity: Yes  Lifestyle  . Physical activity:    Days per week: Not on file    Minutes per session: Not on file  . Stress: Not on file  Relationships  . Social  connections:    Talks on phone: Not on file    Gets together: Not on file    Attends religious service: Not on file    Active member of club or organization: Not on file    Attends meetings of clubs or organizations: Not on file    Relationship status: Not on file  Other Topics Concern  . Not on file  Social History Narrative  . Not on file    [facility] 11/07/2018 07/02/2018 07/02/2018 02/21/2018 11/24/2017  Insurance Status Insured Insured Uninsured Uninsured Uninsured Uninsured  How did you hear? - - - radio - [facility]  Gender at birth Male Male Male Male Male Male  Gender identity cis-Male cis-Male cis-Male - cis-Male cis-Male  Risk for HIV >5 partners in past 6 mos (regardless of condom use);Condomless vaginal or anal intercourse Condomless vaginal or anal intercourse;>5 partners in past 6 mos (regardless of condom use) Condomless vaginal or anal intercourse;>5 partners in past 6 mos (regardless of condom use) - >5 partners in past 6 mos (regardless of condom use);Hx of STI >5 partners in past 6 mos (regardless of condom use);Condomless vaginal or anal intercourse  Sex Partners Women only Women only Women only - Women only Women only  # sex partners past 3-6 mos 1-3 4-6 4-6 - 1-3 4-6  Sex activity preferences Insertive Insertive Insertive - Insertive Insertive  Condom use Yes Yes Yes - No Yes  % condom use 80 70 45 - - 99  Treated for STI? No No No - No -  HIV symptoms? N/A N/A N/A - N/A N/A  PrEP Eligibility Substantial risk for HIV HIV negative;CrCl >60 ml/min - - - -    Labs:  SCr: Lab Results  Component Value Date   CREATININE 1.44 (H) 11/07/2018   CREATININE 1.21 07/02/2018   CREATININE 0.98 02/21/2018   CREATININE 1.06 11/24/2017   CREATININE 1.10 02/23/2017   HIV Lab Results  Component Value Date   HIV NON-REACTIVE 11/07/2018   HIV NON-REACTIVE 07/02/2018   HIV NON-REACTIVE 02/21/2018   HIV NON-REACTIVE 11/24/2017   Hepatitis  B Lab Results  Component Value Date   HEPBSAB REACTIVE (A) 11/24/2017   HEPBSAG NON-REACTIVE 11/24/2017   Hepatitis C No results found for: HEPCAB, HCVRNAPCRQN Hepatitis A No results found for: HAV RPR and STI Lab Results  Component Value Date   LABRPR NON-REACTIVE 11/07/2018   LABRPR NON-REACTIVE 11/24/2017    STI Results GC CT  11/07/2018 Negative Negative  11/24/2017 Negative Negative    Assessment: Harry Gonzales is here today for his 3 month PrEP follow-up visit.  He continues to take Descovy every day with no issues or side effects.  He hasn't had many partners lately due to the [place] 19 pandemic and sheltering at home.    He is having issues with his new insurance.  He was previously uninsured but recently got BCBS through the [employer] through [facility].  He received a bill for a little over $200 from our last visit, because we are now out of network.  He really does not want to go to another clinic to establish care as he is really comfortable here.  We discussed several options including him transferring his PrEP care to [facility] gave him their number and told him to call and ask about their PrEP program.  He usually gets tested at the [facility] for free and has decided that he will continue to do that.  He does have a PCP now through [facility]. He was concerned about getting his kidneys and liver checked, which I explained would only need to be checked twice per year and that his PCP can do that.    We agreed that he would go to the [facility] to get HIV tested every 3 months and bring me the lab results so that I could continue providing him PrEP.  He is very low risk as he only has male partners and usually uses condoms with his encounters.  He just had a HIV test done a few weeks back and has not been sexually active since then, so I will send in refills today and told him to make sure to get tested before he ran out of refills and to let me know and I would  gladly refill his Rx. He agrees with the plan.   Plan: - Send in Descovy x 3 months refill to [facility] - Call me after you get tested in 3 months  Harry Gonzales, PharmD, BCIDP, AAHIVP, [facility] for Infectious Disease 03/26/2019, 11:38 AM

## 2019-03-25 NOTE — Telephone Encounter (Signed)
[facility] Patient Advocate Encounter    Findings of the benefits investigation conducted this morning via test claims for the patient's upcoming appointment on 03/25/2019 are as follows:   Insurance: [employer] (active) Test run with drug historically used, will run another test claim if new drug prescribed Estimated copay amount: $0  [facility] Patient Advocate will follow up once patient arrives for their appointment.  Bartholomew Crews, CPhT Specialty Pharmacy Patient [facility] for Infectious Disease Phone: [phone] Fax: [phone] 03/25/2019 9:03 AM

## 2019-03-25 NOTE — Patient Instructions (Signed)
[facility]  [id number]

## 2019-03-26 MED ORDER — EMTRICITABINE-TENOFOVIR AF 200-25 MG PO TABS: 1.0000 | ORAL_TABLET | Freq: Every day | ORAL | 2 refills | Status: DC

## 2019-03-26 MED FILL — DESCOVY 200-25 MG TABS: 200-25 | 30 days supply | Qty: 30 | Fill #0

## 2019-04-22 MED FILL — DESCOVY 200-25 MG TABS: 200-25 | 30 days supply | Qty: 30 | Fill #1

## 2019-05-20 MED FILL — DESCOVY 200-25 MG TABS: 200-25 | 30 days supply | Qty: 30 | Fill #2

## 2019-06-05 ENCOUNTER — Other Ambulatory Visit: Payer: Self-pay

## 2019-06-05 DIAGNOSIS — Z20822 Contact with and (suspected) exposure to covid-19: Secondary | ICD-10-CM

## 2019-06-06 LAB — NOVEL CORONAVIRUS, NAA: SARS-CoV-2, NAA: NOT DETECTED

## 2019-07-09 ENCOUNTER — Ambulatory Visit (INDEPENDENT_AMBULATORY_CARE_PROVIDER_SITE_OTHER): Payer: BLUE CROSS/BLUE SHIELD | Admitting: Pharmacist

## 2019-07-09 ENCOUNTER — Other Ambulatory Visit: Payer: Self-pay

## 2019-07-09 DIAGNOSIS — Z7251 High risk heterosexual behavior: Secondary | ICD-10-CM

## 2019-07-09 MED ORDER — DESCOVY 200-25 MG PO TABS: 1.0000 | ORAL_TABLET | Freq: Every day | ORAL | 2 refills | Status: DC

## 2019-07-09 MED FILL — DESCOVY 200-25 MG TABS: 200-25 | 30 days supply | Qty: 30 | Fill #0

## 2019-07-09 NOTE — Progress Notes (Signed)
Date:  07/09/2019   HPI: Harry Gonzales is a 48 y.o. male who presents to the [facility] clinic for 3 month PrEP follow-up.  Insured   [x]    Uninsured  []    There are no active problems to display for this patient.   Patient's Medications  New Prescriptions   No medications on file  Previous Medications   ACYCLOVIR (ZOVIRAX) 400 MG TABLET    Take 1 tablet (400 mg total) by mouth 2 (two) times daily.   FENOFIBRATE (TRICOR) 145 MG TABLET    Take 1 tablet (145 mg total) by mouth daily.   PRAVASTATIN (PRAVACHOL) 40 MG TABLET    Take 2 tablets (80 mg total) by mouth daily.  Modified Medications   Modified Medication Previous Medication   EMTRICITABINE-TENOFOVIR AF (DESCOVY) 200-25 MG TABLET emtricitabine-tenofovir AF (DESCOVY) 200-25 MG tablet      Take 1 tablet by mouth daily.    Take 1 tablet by mouth daily.  Discontinued Medications   No medications on file    Allergies: Allergies  Allergen Reactions  . Codeine Nausea And Vomiting    Past Medical History: Past Medical History:  Diagnosis Date  . Hypercholesterolemia     Social History: Social History   Socioeconomic History  . Marital status: Divorced    Spouse name: Not on file  . Number of children: Not on file  . Years of education: Not on file  . Highest education level: Not on file  Occupational History  . Not on file  Social Needs  . Financial resource strain: Not on file  . Food insecurity    Worry: Not on file    Inability: Not on file  . Transportation needs    Medical: Not on file    Non-medical: Not on file  Tobacco Use  . Smoking status: Light Tobacco Smoker  . Smokeless tobacco: Never Used  Substance and Sexual Activity  . Alcohol use: Yes    Comment: several shots and several 24 oz beers per day  . Drug use: No  . Sexual activity: Yes  Lifestyle  . Physical activity    Days per week: Not on file    Minutes per session: Not on file  . Stress: Not on file  Relationships  . Social  Herbalist on phone: Not on file    Gets together: Not on file    Attends religious service: Not on file    Active member of club or organization: Not on file    Attends meetings of clubs or organizations: Not on file    Relationship status: Not on file  Other Topics Concern  . Not on file  Social History Narrative  . Not on file    [facility] HIV PREP FLOWSHEET RESULTS 07/09/2019 03/26/2019 11/07/2018 07/02/2018 07/02/2018 02/21/2018 11/24/2017  Insurance Status Insured Insured Insured Uninsured Uninsured Uninsured Uninsured  How did you hear? - - - - radio - [facility]  Gender at birth Male Male Male Male Male Male Male  Gender identity cis-Male cis-Male cis-Male cis-Male - cis-Male cis-Male  Risk for HIV - >5 partners in past 6 mos (regardless of condom use);Condomless vaginal or anal intercourse Condomless vaginal or anal intercourse;>5 partners in past 6 mos (regardless of condom use) Condomless vaginal or anal intercourse;>5 partners in past 6 mos (regardless of condom use) - >5 partners in past 6 mos (regardless of condom use);Hx of STI >5 partners in past 6 mos (regardless of condom  use);Condomless vaginal or anal intercourse  Sex Partners Women only Women only Women only Women only - Women only Women only  # sex partners past 3-6 mos - 1-3 4-6 4-6 - 1-3 4-6  Sex activity preferences - Insertive Insertive Insertive - Insertive Insertive  Condom use - Yes Yes Yes - No Yes  % condom use - 80 70 45 - - 99  Treated for STI? - No No No - No -  HIV symptoms? N/A N/A N/A N/A - N/A N/A  PrEP Eligibility CrCl >60 ml/min Substantial risk for HIV HIV negative;CrCl >60 ml/min - - - -    Labs:  SCr: Lab Results  Component Value Date   CREATININE 1.44 (H) 11/07/2018   CREATININE 1.21 07/02/2018   CREATININE 0.98 02/21/2018   CREATININE 1.06 11/24/2017   CREATININE 1.10 02/23/2017   HIV Lab Results  Component Value Date   HIV NON-REACTIVE 11/07/2018   HIV NON-REACTIVE 07/02/2018    HIV NON-REACTIVE 02/21/2018   HIV NON-REACTIVE 11/24/2017   Hepatitis B Lab Results  Component Value Date   HEPBSAB REACTIVE (A) 11/24/2017   HEPBSAG NON-REACTIVE 11/24/2017   Hepatitis C No results found for: HEPCAB, HCVRNAPCRQN Hepatitis A No results found for: HAV RPR and STI Lab Results  Component Value Date   LABRPR NON-REACTIVE 11/07/2018   LABRPR NON-REACTIVE 11/24/2017    STI Results GC CT  11/07/2018 Negative Negative  11/24/2017 Negative Negative    Assessment: Harry Gonzales is here for his 3 month PrEP follow-up visit. He continues to take Descovy everyday without issues or side effects. Due to his new insurance he is having his labs done at [facility] and [facility] instead of here at clinic. He brought in the results from lab work done on 06/18/2019. His chemistry panel is WNL and his HIV Ag/Ab was non-reactive. He was also tested for chlamydia, gonorrhea and syphilis which were negative.  In the future he will continue to have his labs done at [facility] and [facility] and then bring in and drop off his results to [place] so we can send the prescription for his medication without having to do a formal visit. His PCP could probably prescribe his Descovy and check labs for him but he feels more comfortable at the clinic seeing Cassie and would like to continue care here.   Patient requested to have his blood pressure drawn today out of concern that it was noted to be elevated during a recent visit at [facility]. His BP was 115/28mmHg.   Plan: -Descovy x 3 months to [facility] -Bring in lab results in 3 months for a refill   Jettie Pagan, PharmD PGY2 Infectious Disease Pharmacy Resident  [facility] for Infectious Disease 07/09/2019, 9:52 AM

## 2019-08-05 MED FILL — DESCOVY 200-25 MG TABS: 200-25 | 30 days supply | Qty: 30 | Fill #1

## 2019-08-30 MED FILL — DESCOVY 200-25 MG TABS: 200-25 | 30 days supply | Qty: 30 | Fill #2

## 2019-10-24 ENCOUNTER — Other Ambulatory Visit: Payer: Self-pay | Admitting: Pharmacist

## 2019-10-24 DIAGNOSIS — Z7251 High risk heterosexual behavior: Secondary | ICD-10-CM

## 2019-10-24 MED ORDER — DESCOVY 200-25 MG PO TABS: 1.0000 | ORAL_TABLET | Freq: Every day | ORAL | 2 refills | Status: DC

## 2019-10-24 NOTE — Progress Notes (Signed)
Patient sent lab results - HIV negative. Will send in refills of Descovy.

## 2019-10-28 MED FILL — DESCOVY 200-25 MG TABS: 200-25 | 30 days supply | Qty: 30 | Fill #0

## 2019-11-22 MED FILL — DESCOVY 200-25 MG TABS: 200-25 | 30 days supply | Qty: 30 | Fill #1

## 2019-12-20 MED FILL — DESCOVY 200-25 MG TABS: 200-25 | 30 days supply | Qty: 30 | Fill #2

## 2020-02-20 ENCOUNTER — Other Ambulatory Visit: Payer: Self-pay | Admitting: Pharmacist

## 2020-02-20 DIAGNOSIS — Z7251 High risk heterosexual behavior: Secondary | ICD-10-CM

## 2020-02-20 MED ORDER — DESCOVY 200-25 MG PO TABS: 1.0000 | ORAL_TABLET | Freq: Every day | ORAL | 0 refills | Status: DC

## 2020-02-21 MED ORDER — DESCOVY 200-25 MG PO TABS: 1.0000 | ORAL_TABLET | Freq: Every day | ORAL | 1 refills | Status: DC

## 2020-02-21 MED FILL — DESCOVY 200-25 MG TABS: 200-25 | 30 days supply | Qty: 30 | Fill #0

## 2020-02-21 NOTE — Addendum Note (Signed)
Addended by: Aggie Cosier L on: 02/21/2020 12:20 PM   Modules accepted: Orders

## 2020-02-21 NOTE — Progress Notes (Signed)
Patient faxed negative HIV results. Will refill PrEP Rx.

## 2020-03-18 MED FILL — DESCOVY 200-25 MG TABS: 200-25 | 30 days supply | Qty: 30 | Fill #0

## 2020-04-15 MED FILL — DESCOVY 200-25 MG TABS: 200-25 | 30 days supply | Qty: 30 | Fill #1

## 2020-07-07 ENCOUNTER — Other Ambulatory Visit: Payer: Self-pay | Admitting: Pharmacist

## 2020-07-07 ENCOUNTER — Encounter: Payer: Self-pay | Admitting: Pharmacist

## 2020-07-07 DIAGNOSIS — Z7251 High risk heterosexual behavior: Secondary | ICD-10-CM

## 2020-07-07 MED ORDER — DESCOVY 200-25 MG PO TABS: 1.0000 | ORAL_TABLET | Freq: Every day | ORAL | 2 refills | Status: DC

## 2020-07-07 NOTE — Progress Notes (Signed)
Received fax with patient's lab results. HIV antibody is negative. Will send in 3 more months of Descovy to [facility].

## 2020-07-30 MED FILL — DESCOVY 200-25 MG TABS: 200-25 | 30 days supply | Qty: 30 | Fill #0

## 2020-08-25 MED FILL — DESCOVY 200-25 MG TABS: 200-25 | 30 days supply | Qty: 30 | Fill #1

## 2020-09-21 MED FILL — DESCOVY 200-25 MG TABS: 200-25 | 30 days supply | Qty: 30 | Fill #2

## 2020-12-18 ENCOUNTER — Telehealth: Payer: Self-pay | Admitting: *Deleted

## 2020-12-18 NOTE — Telephone Encounter (Signed)
Left message asking patient to call back and schedule appointment with [facility]. Andree Coss, RN

## 2020-12-18 NOTE — Telephone Encounter (Signed)
Patient called, asked to speak with Cassie about his Descovy refill.  He gets labs drawn offsite, has them faxed to [facility] for PrEP refills.  Last lab drawn 2/17, HIV negative.  He was last given 3 month refill of descovy 07/07/20.  He was spreading out his descovy, taking them every other day. He has been out of medication for 1 week. Please advise on refill of Descovy. Andree Coss, RN

## 2020-12-18 NOTE — Telephone Encounter (Signed)
Can we set up a televisit call with him so we can troubleshoot whether he needs another hiv test before we prescribe?  I am happy to talk with him next week more as I don't see him set up with anyone actually.

## 2020-12-22 ENCOUNTER — Other Ambulatory Visit: Payer: Self-pay | Admitting: Infectious Diseases

## 2020-12-22 ENCOUNTER — Telehealth (INDEPENDENT_AMBULATORY_CARE_PROVIDER_SITE_OTHER): Payer: BLUE CROSS/BLUE SHIELD | Admitting: Infectious Diseases

## 2020-12-22 ENCOUNTER — Other Ambulatory Visit: Payer: Self-pay

## 2020-12-22 ENCOUNTER — Encounter: Payer: Self-pay | Admitting: Infectious Diseases

## 2020-12-22 DIAGNOSIS — Z7251 High risk heterosexual behavior: Secondary | ICD-10-CM | POA: Diagnosis not present

## 2020-12-22 MED ORDER — DESCOVY 200-25 MG PO TABS: 1.0000 | ORAL_TABLET | Freq: Every day | ORAL | 2 refills | Status: DC

## 2020-12-22 NOTE — Progress Notes (Signed)
Date:  12/22/2020   Insured   [x]    Uninsured  []     HPI  Harry Gonzales is a 50 y.o. male on once daily Descovy for HIV prevention.  Doing well and no missed doses. No concern for side effects.  HIV 4th generation screening was recently negative at [facility] 12/14/2020.  Gonorrhea, chlamydia RPR negative at that visit as well.   Serum creatinine 1.15 recently 11/2020   Demographics Race:  Black or African American [2] Marital Status:  Divorced   Allergies Allergies  Allergen Reactions  . Codeine Nausea And Vomiting    Past Medical History:  Diagnosis Date  . Hypercholesterolemia     Outpatient Encounter Medications as of 12/22/2020  Medication Sig  . acyclovir (ZOVIRAX) 400 MG tablet Take 1 tablet (400 mg total) by mouth 2 (two) times daily.  12/2020 emtricitabine-tenofovir AF (DESCOVY) 200-25 MG tablet Take 1 tablet by mouth daily.  . fenofibrate (TRICOR) 145 MG tablet Take 1 tablet (145 mg total) by mouth daily.  . pravastatin (PRAVACHOL) 40 MG tablet Take 2 tablets (80 mg total) by mouth daily.   No facility-administered encounter medications on file as of 12/22/2020.    Social History   Tobacco Use  Smoking Status Light Tobacco Smoker  Smokeless Tobacco Never Used   Social History   Substance and Sexual Activity  Alcohol Use Yes   Comment: several shots and several 24 oz beers per day    Drug use?   Yes []  No [x]   Injectable drug use?   Yes []     No [x]   Sexual History  Missing doses? Yes []    No  [x]   [facility] HIV PREP FLOWSHEET RESULTS 07/09/2019 03/26/2019 11/07/2018 07/02/2018 07/02/2018 02/21/2018 11/24/2017  Insurance Status Insured Insured Insured Uninsured Uninsured Uninsured Uninsured  How did you hear? - - - - radio - [facility]  Gender at birth Male Male Male Male Male Male Male  Gender identity cis-Male cis-Male cis-Male cis-Male - cis-Male cis-Male  Risk for HIV - >5 partners in past 6 mos (regardless of condom use);Condomless vaginal or anal intercourse  Condomless vaginal or anal intercourse;>5 partners in past 6 mos (regardless of condom use) Condomless vaginal or anal intercourse;>5 partners in past 6 mos (regardless of condom use) - >5 partners in past 6 mos (regardless of condom use);Hx of STI >5 partners in past 6 mos (regardless of condom use);Condomless vaginal or anal intercourse  Sex Partners Women only Women only Women only Women only - Women only Women only  # sex partners past 3-6 mos - 1-3 4-6 4-6 - 1-3 4-6  Sex activity preferences - Insertive Insertive Insertive - Insertive Insertive  Condom use - Yes Yes Yes - No Yes  % condom use - 80 70 45 - - 99  Treated for STI? - No No No - No -  HIV symptoms? N/A N/A N/A N/A - N/A N/A  PrEP Eligibility CrCl >60 ml/min Substantial risk for HIV HIV negative;CrCl >60 ml/min - - - -    Labs: Creatinine Lab Results  Component Value Date   CREATININE 1.44 (H) 11/07/2018   CREATININE 1.21 07/02/2018   CREATININE 0.98 02/21/2018   CREATININE 1.06 11/24/2017   CREATININE 1.10 02/23/2017    HIV Lab Results  Component Value Date   HIV NON-REACTIVE 11/07/2018   HIV NON-REACTIVE 07/02/2018   HIV NON-REACTIVE 02/21/2018   HIV NON-REACTIVE 11/24/2017    GFR CrCl cannot be calculated (Patient's most recent lab result is  older than the maximum 21 days allowed.).   Hepatitis B Lab Results  Component Value Date   HEPBSAB REACTIVE (A) 11/24/2017   Lab Results  Component Value Date   HEPBSAG NON-REACTIVE 11/24/2017    Hepatitis C No results found for: HCVAB  Hepatitis A No results found for: HAV  RPR and STI Lab Results  Component Value Date   LABRPR NON-REACTIVE 11/07/2018   LABRPR NON-REACTIVE 11/24/2017    STI Results GC CT  11/07/2018 Negative Negative  11/24/2017 Negative Negative     Assessment & Plan:   Harry Gonzales is a 50 y.o. male currently taking Descovy everyday for HIV prevention. He continues to take this everyday without any missed doses. Recently had HIV  testing and STI screening 12/14/2020 and all found to be negative.  Creatinine in care everywhere on 2-22 recently 1.15.    Follow Up Instructions: Will refill 33-months of Descovy for him.    14m follow up with Cassie scheduled out in June arranged today.    I discussed the assessment and treatment plan with the patient. The patient was provided an opportunity to ask questions and all were answered. The patient agreed with the plan and demonstrated an understanding of the instructions.   The patient was advised to call back or seek an in-person evaluation if the symptoms worsen or if the condition fails to improve as anticipated.  I provided 8 minutes of non-face-to-face time during this encounter.   Rexene Alberts, MSN, NP-C [facility]  [email or web address] Pager: [phone] Office: [phone] [facility] Main Line: [phone]

## 2021-01-12 ENCOUNTER — Other Ambulatory Visit (HOSPITAL_COMMUNITY): Payer: Self-pay

## 2021-01-21 ENCOUNTER — Other Ambulatory Visit (HOSPITAL_COMMUNITY): Payer: Self-pay

## 2021-01-21 MED FILL — Emtricitabine-Tenofovir Alafenamide Fumarate Tab 200-25 MG: ORAL | 30 days supply | Qty: 30 | Fill #0 | Status: AC

## 2021-01-25 ENCOUNTER — Other Ambulatory Visit (HOSPITAL_COMMUNITY): Payer: Self-pay

## 2021-02-18 ENCOUNTER — Other Ambulatory Visit (HOSPITAL_COMMUNITY): Payer: Self-pay

## 2021-02-18 MED FILL — Emtricitabine-Tenofovir Alafenamide Fumarate Tab 200-25 MG: ORAL | 30 days supply | Qty: 30 | Fill #1 | Status: AC

## 2021-02-22 ENCOUNTER — Other Ambulatory Visit (HOSPITAL_COMMUNITY): Payer: Self-pay

## 2021-03-17 ENCOUNTER — Other Ambulatory Visit (HOSPITAL_COMMUNITY): Payer: Self-pay

## 2021-03-23 ENCOUNTER — Ambulatory Visit (INDEPENDENT_AMBULATORY_CARE_PROVIDER_SITE_OTHER): Payer: BLUE CROSS/BLUE SHIELD | Admitting: Pharmacist

## 2021-03-23 ENCOUNTER — Other Ambulatory Visit (HOSPITAL_COMMUNITY): Payer: Self-pay

## 2021-03-23 ENCOUNTER — Other Ambulatory Visit: Payer: Self-pay

## 2021-03-23 DIAGNOSIS — Z7251 High risk heterosexual behavior: Secondary | ICD-10-CM

## 2021-03-23 MED ORDER — EMTRICITABINE-TENOFOVIR AF 200-25 MG PO TABS
1.0000 | ORAL_TABLET | Freq: Every day | ORAL | 0 refills | Status: DC
  Filled 2021-03-23 – 2021-03-25 (×3): qty 30, 30d supply, fill #0

## 2021-03-23 NOTE — Progress Notes (Signed)
Date:  03/23/2021   HPI: Harry Gonzales is a 50 y.o. male who presents to the [facility] pharmacy clinic for HIV PrEP follow-up.  Insured   [x]    Uninsured  []    Patient Active Problem List   Diagnosis Date Noted  . High risk sexual behavior 12/22/2020    Patient's Medications  New Prescriptions   No medications on file  Previous Medications   ACYCLOVIR (ZOVIRAX) 400 MG TABLET    Take 1 tablet (400 mg total) by mouth 2 (two) times daily.   EMTRICITABINE-TENOFOVIR AF (DESCOVY) 200-25 MG TABLET    TAKE 1 TABLET BY MOUTH DAILY.   FENOFIBRATE (TRICOR) 145 MG TABLET    Take 1 tablet (145 mg total) by mouth daily.   PRAVASTATIN (PRAVACHOL) 40 MG TABLET    Take 2 tablets (80 mg total) by mouth daily.  Modified Medications   No medications on file  Discontinued Medications   No medications on file    Allergies: Allergies  Allergen Reactions  . Codeine Nausea And Vomiting    Past Medical History: Past Medical History:  Diagnosis Date  . Hypercholesterolemia     Social History: Social History   Socioeconomic History  . Marital status: Divorced    Spouse name: Not on file  . Number of children: Not on file  . Years of education: Not on file  . Highest education level: Not on file  Occupational History  . Not on file  Tobacco Use  . Smoking status: Light Tobacco Smoker  . Smokeless tobacco: Never Used  Vaping Use  . Vaping Use: Never used  Substance and Sexual Activity  . Alcohol use: Yes    Comment: several shots and several 24 oz beers per day  . Drug use: No  . Sexual activity: Yes  Other Topics Concern  . Not on file  Social History Narrative  . Not on file   Social Determinants of Health   Financial Resource Strain: Not on file  Food Insecurity: Not on file  Transportation Needs: Not on file  Physical Activity: Not on file  Stress: Not on file  Social Connections: Not on file    [facility] HIV PREP FLOWSHEET RESULTS 07/09/2019 03/26/2019 11/07/2018 07/02/2018 07/02/2018  02/21/2018 11/24/2017  Insurance Status Insured Insured Insured Uninsured Uninsured Uninsured Uninsured  How did you hear? - - - - radio - [facility]  Gender at birth Male Male Male Male Male Male Male  Gender identity cis-Male cis-Male cis-Male cis-Male - cis-Male cis-Male  Risk for HIV - >5 partners in past 6 mos (regardless of condom use);Condomless vaginal or anal intercourse Condomless vaginal or anal intercourse;>5 partners in past 6 mos (regardless of condom use) Condomless vaginal or anal intercourse;>5 partners in past 6 mos (regardless of condom use) - >5 partners in past 6 mos (regardless of condom use);Hx of STI >5 partners in past 6 mos (regardless of condom use);Condomless vaginal or anal intercourse  Sex Partners Women only Women only Women only Women only - Women only Women only  # sex partners past 3-6 mos - 1-3 4-6 4-6 - 1-3 4-6  Sex activity preferences - Insertive Insertive Insertive - Insertive Insertive  Condom use - Yes Yes Yes - No Yes  % condom use - 80 70 45 - - 99  Treated for STI? - No No No - No -  HIV symptoms? N/A N/A N/A N/A - N/A N/A  PrEP Eligibility CrCl >60 ml/min Substantial risk for HIV HIV negative;CrCl >  60 ml/min - - - -    Labs:  SCr: Lab Results  Component Value Date   CREATININE 1.44 (H) 11/07/2018   CREATININE 1.21 07/02/2018   CREATININE 0.98 02/21/2018   CREATININE 1.06 11/24/2017   CREATININE 1.10 02/23/2017   HIV Lab Results  Component Value Date   HIV NON-REACTIVE 11/07/2018   HIV NON-REACTIVE 07/02/2018   HIV NON-REACTIVE 02/21/2018   HIV NON-REACTIVE 11/24/2017   Hepatitis B Lab Results  Component Value Date   HEPBSAB REACTIVE (A) 11/24/2017   HEPBSAG NON-REACTIVE 11/24/2017   Hepatitis C No results found for: HEPCAB, HCVRNAPCRQN Hepatitis A No results found for: HAV RPR and STI Lab Results  Component Value Date   LABRPR NON-REACTIVE 11/07/2018   LABRPR NON-REACTIVE 11/24/2017    STI Results GC CT  11/07/2018  Negative Negative  11/24/2017 Negative Negative    Assessment: Harry Gonzales presents to clinic today for his 3-month PrEP follow-up after calling Judeth Cornfield in March. He is currently on Descovy without any reported missed doses or side effects. He currently has enough Descovy to last through Friday or so. He got labs and urine cytology collected at his sickle cell clinic this morning, so no labs were drawn here. Will have the lab results sent to [place] but will still send in one refill for Descovy for now so he can have enough to last through the results coming back (which can take up to 2 weeks). He states he has been with 4 sexual partners since his last appointment and has not used condoms.   Plan: Follow-up with labs/urine cytology collected at sickle cell clinic Prescribe Descovy x 1 month for now and then an additional 2 months once results return negative Follow-up with Cassie on 06/29/21 at 1000   Margarite Gouge, PharmD PGY2 ID Pharmacy Resident [phone]  03/23/2021, 11:34 AM

## 2021-03-24 ENCOUNTER — Other Ambulatory Visit (HOSPITAL_COMMUNITY): Payer: Self-pay

## 2021-03-25 ENCOUNTER — Other Ambulatory Visit (HOSPITAL_COMMUNITY): Payer: Self-pay

## 2021-03-25 ENCOUNTER — Other Ambulatory Visit: Payer: Self-pay | Admitting: Pharmacist

## 2021-03-25 DIAGNOSIS — Z7251 High risk heterosexual behavior: Secondary | ICD-10-CM

## 2021-03-25 MED ORDER — EMTRICITABINE-TENOFOVIR AF 200-25 MG PO TABS
1.0000 | ORAL_TABLET | Freq: Every day | ORAL | 0 refills | Status: DC
  Filled 2021-03-25: qty 30, 30d supply, fill #0

## 2021-04-15 ENCOUNTER — Encounter: Payer: Self-pay | Admitting: Pharmacist

## 2021-04-15 ENCOUNTER — Other Ambulatory Visit (HOSPITAL_COMMUNITY): Payer: Self-pay

## 2021-04-22 ENCOUNTER — Other Ambulatory Visit (HOSPITAL_COMMUNITY): Payer: Self-pay

## 2021-04-22 ENCOUNTER — Other Ambulatory Visit: Payer: Self-pay | Admitting: Infectious Disease

## 2021-04-22 DIAGNOSIS — Z7251 High risk heterosexual behavior: Secondary | ICD-10-CM

## 2021-05-03 ENCOUNTER — Other Ambulatory Visit (HOSPITAL_COMMUNITY): Payer: Self-pay

## 2021-05-31 ENCOUNTER — Other Ambulatory Visit (HOSPITAL_COMMUNITY): Payer: Self-pay

## 2021-06-29 ENCOUNTER — Ambulatory Visit: Payer: BLUE CROSS/BLUE SHIELD | Admitting: Pharmacist

## 2021-07-22 ENCOUNTER — Other Ambulatory Visit (HOSPITAL_COMMUNITY): Payer: Self-pay

## 2021-07-23 ENCOUNTER — Other Ambulatory Visit (HOSPITAL_COMMUNITY): Payer: Self-pay

## 2021-07-23 ENCOUNTER — Other Ambulatory Visit: Payer: Self-pay | Admitting: Pharmacist

## 2021-07-23 DIAGNOSIS — Z7251 High risk heterosexual behavior: Secondary | ICD-10-CM

## 2021-07-23 MED ORDER — EMTRICITABINE-TENOFOVIR AF 200-25 MG PO TABS
1.0000 | ORAL_TABLET | Freq: Every day | ORAL | 2 refills | Status: DC
  Filled 2021-07-23: qty 30, 30d supply, fill #0
  Filled 2021-08-19: qty 30, 30d supply, fill #1
  Filled 2021-09-14: qty 30, 30d supply, fill #2

## 2021-08-13 ENCOUNTER — Other Ambulatory Visit (HOSPITAL_COMMUNITY): Payer: Self-pay

## 2021-08-17 ENCOUNTER — Other Ambulatory Visit (HOSPITAL_COMMUNITY): Payer: Self-pay

## 2021-08-19 ENCOUNTER — Other Ambulatory Visit (HOSPITAL_COMMUNITY): Payer: Self-pay

## 2021-08-20 ENCOUNTER — Other Ambulatory Visit (HOSPITAL_COMMUNITY): Payer: Self-pay

## 2021-09-14 ENCOUNTER — Other Ambulatory Visit (HOSPITAL_COMMUNITY): Payer: Self-pay

## 2021-09-17 ENCOUNTER — Other Ambulatory Visit (HOSPITAL_COMMUNITY): Payer: Self-pay

## 2021-10-13 ENCOUNTER — Other Ambulatory Visit (HOSPITAL_COMMUNITY): Payer: Self-pay

## 2021-10-15 ENCOUNTER — Other Ambulatory Visit (HOSPITAL_COMMUNITY): Payer: Self-pay

## 2021-11-03 ENCOUNTER — Other Ambulatory Visit (HOSPITAL_COMMUNITY): Payer: Self-pay

## 2021-11-03 ENCOUNTER — Other Ambulatory Visit: Payer: Self-pay | Admitting: Pharmacist

## 2021-11-03 DIAGNOSIS — Z7251 High risk heterosexual behavior: Secondary | ICD-10-CM

## 2021-11-04 ENCOUNTER — Other Ambulatory Visit (HOSPITAL_COMMUNITY): Payer: Self-pay

## 2021-11-05 ENCOUNTER — Other Ambulatory Visit: Payer: Self-pay

## 2021-11-05 ENCOUNTER — Ambulatory Visit (INDEPENDENT_AMBULATORY_CARE_PROVIDER_SITE_OTHER): Payer: BLUE CROSS/BLUE SHIELD | Admitting: Pharmacist

## 2021-11-05 ENCOUNTER — Other Ambulatory Visit (HOSPITAL_COMMUNITY): Payer: Self-pay

## 2021-11-05 ENCOUNTER — Other Ambulatory Visit: Payer: Self-pay | Admitting: Pharmacist

## 2021-11-05 DIAGNOSIS — Z79899 Other long term (current) drug therapy: Secondary | ICD-10-CM

## 2021-11-05 DIAGNOSIS — Z7251 High risk heterosexual behavior: Secondary | ICD-10-CM

## 2021-11-05 MED ORDER — EMTRICITABINE-TENOFOVIR AF 200-25 MG PO TABS
1.0000 | ORAL_TABLET | Freq: Every day | ORAL | 2 refills | Status: DC
  Filled 2021-11-05: qty 30, 30d supply, fill #0
  Filled 2021-11-25: qty 30, 30d supply, fill #1
  Filled 2021-12-23: qty 30, 30d supply, fill #2

## 2021-11-05 NOTE — Progress Notes (Signed)
Date:  11/05/2021   HPI: Harry Gonzales is a 51 y.o. male who presents to the [place] clinic for HIV PrEP follow-up.  Insured   [x]    Uninsured  []    Patient Active Problem List   Diagnosis Date Noted   High risk sexual behavior 12/22/2020    Patient's Medications  New Prescriptions   No medications on file  Previous Medications   ACYCLOVIR (ZOVIRAX) 400 MG TABLET    Take 1 tablet (400 mg total) by mouth 2 (two) times daily.   EMTRICITABINE-TENOFOVIR AF (DESCOVY) 200-25 MG TABLET    Take 1 tablet by mouth daily.   FENOFIBRATE (TRICOR) 145 MG TABLET    Take 1 tablet (145 mg total) by mouth daily.   PRAVASTATIN (PRAVACHOL) 40 MG TABLET    Take 2 tablets (80 mg total) by mouth daily.  Modified Medications   No medications on file  Discontinued Medications   No medications on file    Allergies: Allergies  Allergen Reactions   Codeine Nausea And Vomiting    Past Medical History: Past Medical History:  Diagnosis Date   Hypercholesterolemia     Social History: Social History   Socioeconomic History   Marital status: Divorced    Spouse name: Not on file   Number of children: Not on file   Years of education: Not on file   Highest education level: Not on file  Occupational History   Not on file  Tobacco Use   Smoking status: Light Smoker   Smokeless tobacco: Never  Vaping Use   Vaping Use: Never used  Substance and Sexual Activity   Alcohol use: Yes    Comment: several shots and several 24 oz beers per day   Drug use: No   Sexual activity: Yes  Other Topics Concern   Not on file  Social History Narrative   Not on file   Social Determinants of Health   Financial Resource Strain: Not on file  Food Insecurity: Not on file  Transportation Needs: Not on file  Physical Activity: Not on file  Stress: Not on file  Social Connections: Not on file    [facility] HIV PREP FLOWSHEET RESULTS 07/09/2019 03/26/2019 11/07/2018 07/02/2018 07/02/2018 02/21/2018 11/24/2017   Insurance Status Insured Insured Insured Uninsured Uninsured Uninsured Uninsured  How did you hear? - - - - radio - [place]  Gender at birth Male Male Male Male Male Male Male  Gender identity cis-Male cis-Male cis-Male cis-Male - cis-Male cis-Male  Risk for HIV - >5 partners in past 6 mos (regardless of condom use);Condomless vaginal or anal intercourse Condomless vaginal or anal intercourse;>5 partners in past 6 mos (regardless of condom use) Condomless vaginal or anal intercourse;>5 partners in past 6 mos (regardless of condom use) - >5 partners in past 6 mos (regardless of condom use);Hx of STI >5 partners in past 6 mos (regardless of condom use);Condomless vaginal or anal intercourse  Sex Partners Women only Women only Women only Women only - Women only Women only  # sex partners past 3-6 mos - 1-3 4-6 4-6 - 1-3 4-6  Sex activity preferences - Insertive Insertive Insertive - Insertive Insertive  Condom use - Yes Yes Yes - No Yes  % condom use - 80 70 45 - - 99  Treated for STI? - No No No - No -  HIV symptoms? N/A N/A N/A N/A - N/A N/A  PrEP Eligibility CrCl >60 ml/min Substantial risk for HIV HIV negative;CrCl >60 ml/min - - - -  Labs:  SCr: Lab Results  Component Value Date   CREATININE 1.44 (H) 11/07/2018   CREATININE 1.21 07/02/2018   CREATININE 0.98 02/21/2018   CREATININE 1.06 11/24/2017   CREATININE 1.10 02/23/2017   HIV Lab Results  Component Value Date   HIV NON-REACTIVE 11/07/2018   HIV NON-REACTIVE 07/02/2018   HIV NON-REACTIVE 02/21/2018   HIV NON-REACTIVE 11/24/2017   Hepatitis B Lab Results  Component Value Date   HEPBSAB REACTIVE (A) 11/24/2017   HEPBSAG NON-REACTIVE 11/24/2017   Hepatitis C No results found for: HEPCAB, HCVRNAPCRQN Hepatitis A No results found for: HAV RPR and STI Lab Results  Component Value Date   LABRPR NON-REACTIVE 11/07/2018   LABRPR NON-REACTIVE 11/24/2017    STI Results GC CT  11/07/2018 Negative Negative   11/24/2017 Negative Negative    Assessment: Harry Gonzales presents to clinic today for PrEP follow-up. He has no adherence or adverse event issues. He presented his HIV labs from 09/25/21 which were negative. He will see his primary care team next month at [facility], so we will defer other labs to that visit. Patient is out of medication but sent in more Descovy to [facility] today. Will follow-up with Cassie in 3 months.  Plan: Refill Descovy x 3 months Follow-up with Cassie on 4/20 at 8:45am  Alfonse Spruce, PharmD, CPP Clinical Pharmacist Practitioner Infectious Diseases [place] [facility] 11/05/2021, 12:07 PM

## 2021-11-25 ENCOUNTER — Other Ambulatory Visit (HOSPITAL_COMMUNITY): Payer: Self-pay

## 2021-12-01 ENCOUNTER — Other Ambulatory Visit (HOSPITAL_COMMUNITY): Payer: Self-pay

## 2021-12-23 ENCOUNTER — Other Ambulatory Visit (HOSPITAL_COMMUNITY): Payer: Self-pay

## 2021-12-27 ENCOUNTER — Other Ambulatory Visit (HOSPITAL_COMMUNITY): Payer: Self-pay

## 2021-12-30 ENCOUNTER — Other Ambulatory Visit (HOSPITAL_COMMUNITY): Payer: Self-pay

## 2022-01-18 ENCOUNTER — Other Ambulatory Visit (HOSPITAL_COMMUNITY): Payer: Self-pay

## 2022-01-18 ENCOUNTER — Other Ambulatory Visit: Payer: Self-pay | Admitting: Pharmacist

## 2022-01-18 DIAGNOSIS — Z7251 High risk heterosexual behavior: Secondary | ICD-10-CM

## 2022-01-19 ENCOUNTER — Ambulatory Visit (INDEPENDENT_AMBULATORY_CARE_PROVIDER_SITE_OTHER): Payer: Self-pay | Admitting: Pharmacist

## 2022-01-19 ENCOUNTER — Other Ambulatory Visit (HOSPITAL_COMMUNITY): Payer: Self-pay

## 2022-01-19 ENCOUNTER — Other Ambulatory Visit: Payer: Self-pay

## 2022-01-19 DIAGNOSIS — Z7251 High risk heterosexual behavior: Secondary | ICD-10-CM

## 2022-01-19 MED ORDER — EMTRICITABINE-TENOFOVIR AF 200-25 MG PO TABS
1.0000 | ORAL_TABLET | Freq: Every day | ORAL | 2 refills | Status: DC
  Filled 2022-01-19: qty 30, 30d supply, fill #0
  Filled 2022-02-24: qty 30, 30d supply, fill #1
  Filled 2022-03-18: qty 30, 30d supply, fill #2

## 2022-01-19 NOTE — Patient Instructions (Addendum)
Preventative Services ?

## 2022-01-19 NOTE — Progress Notes (Signed)
? ?Date:  01/19/2022  ? ?HPI: Harry Gonzales is a 51 y.o. male who presents to the [facility] pharmacy clinic for HIV PrEP follow-up. ? ?Insured   [x]    Uninsured  []   ? ?Patient Active Problem List  ? Diagnosis Date Noted  ? High risk sexual behavior 12/22/2020  ? ? ?Patient's Medications  ?New Prescriptions  ? No medications on file  ?Previous Medications  ? ACYCLOVIR (ZOVIRAX) 400 MG TABLET    Take 1 tablet (400 mg total) by mouth 2 (two) times daily.  ? FENOFIBRATE (TRICOR) 145 MG TABLET    Take 1 tablet (145 mg total) by mouth daily.  ? PRAVASTATIN (PRAVACHOL) 40 MG TABLET    Take 2 tablets (80 mg total) by mouth daily.  ?Modified Medications  ? Modified Medication Previous Medication  ? EMTRICITABINE-TENOFOVIR AF (DESCOVY) 200-25 MG TABLET emtricitabine-tenofovir AF (DESCOVY) 200-25 MG tablet  ?    Take 1 tablet by mouth daily.    Take 1 tablet by mouth daily.  ?Discontinued Medications  ? No medications on file  ? ? ?Allergies: ?Allergies  ?Allergen Reactions  ? Codeine Nausea And Vomiting  ? ? ?Past Medical History: ?Past Medical History:  ?Diagnosis Date  ? Hypercholesterolemia   ? ? ?Social History: ?Social History  ? ?Socioeconomic History  ? Marital status: Divorced  ?  Spouse name: Not on file  ? Number of children: Not on file  ? Years of education: Not on file  ? Highest education level: Not on file  ?Occupational History  ? Not on file  ?Tobacco Use  ? Smoking status: Light Smoker  ? Smokeless tobacco: Never  ?Vaping Use  ? Vaping Use: Never used  ?Substance and Sexual Activity  ? Alcohol use: Yes  ?  Comment: several shots and several 24 oz beers per day  ? Drug use: No  ? Sexual activity: Yes  ?Other Topics Concern  ? Not on file  ?Social History Narrative  ? Not on file  ? ?Social Determinants of Health  ? ?Financial Resource Strain: Not on file  ?Food Insecurity: Not on file  ?Transportation Needs: Not on file  ?Physical Activity: Not on file  ?Stress: Not on file  ?Social Connections: Not on file  ? ? ? ?   07/09/2019  ?  9:51 AM 03/26/2019  ? 11:37 AM 11/07/2018  ? 11:37 AM 07/02/2018  ?  3:03 PM 07/02/2018  ?  2:39 PM 02/21/2018  ?  2:11 PM 11/24/2017  ? 11:15 AM  ?[facility] HIV PREP FLOWSHEET RESULTS  ?Insurance Status Insured Insured Insured Uninsured Uninsured Uninsured Uninsured  ?How did you hear?     radio  [facility]  ?Gender at birth Male Male Male Male Male Male Male  ?Gender identity cis-Male cis-Male cis-Male cis-Male  cis-Male cis-Male  ?Risk for HIV  >5 partners in past 6 mos (regardless of condom use);Condomless vaginal or anal intercourse Condomless vaginal or anal intercourse;>5 partners in past 6 mos (regardless of condom use) Condomless vaginal or anal intercourse;>5 partners in past 6 mos (regardless of condom use)  >5 partners in past 6 mos (regardless of condom use);Hx of STI >5 partners in past 6 mos (regardless of condom use);Condomless vaginal or anal intercourse  ?Sex Partners Women only Women only Women only Women only  Women only Women only  ?# sex partners past 3-6 mos  1-3 4-6 4-6  1-3 4-6  ?Sex activity preferences  Insertive Insertive Insertive  Insertive Insertive  ?Condom use  Yes Yes Yes  No Yes  ?% condom use  80 70 45   99  ?Treated for STI?  No No No  No   ?HIV symptoms? N/A N/A N/A N/A  N/A N/A  ?PrEP Eligibility CrCl >60 ml/min Substantial risk for HIV HIV negative;CrCl >60 ml/min      ? ? ?Labs: ? ?SCr: ?Lab Results  ?Component Value Date  ? CREATININE 1.44 (H) 11/07/2018  ? CREATININE 1.21 07/02/2018  ? CREATININE 0.98 02/21/2018  ? CREATININE 1.06 11/24/2017  ? CREATININE 1.10 02/23/2017  ? ?HIV ?Lab Results  ?Component Value Date  ? HIV NON-REACTIVE 11/07/2018  ? HIV NON-REACTIVE 07/02/2018  ? HIV NON-REACTIVE 02/21/2018  ? HIV NON-REACTIVE 11/24/2017  ? ?Hepatitis B ?Lab Results  ?Component Value Date  ? HEPBSAB REACTIVE (A) 11/24/2017  ? HEPBSAG NON-REACTIVE 11/24/2017  ? ?Hepatitis C ?No results found for: HEPCAB, HCVRNAPCRQN ?Hepatitis A ?No results found for: HAV ?RPR and  STI ?Lab Results  ?Component Value Date  ? LABRPR NON-REACTIVE 11/07/2018  ? LABRPR NON-REACTIVE 11/24/2017  ? ? ?STI Results GC CT  ?11/07/2018 ?12:00 AM Negative   Negative    ?11/24/2017 ?12:00 AM Negative   Negative    ? ? ?Assessment: ?Harry Gonzales is here today to follow up for HIV PrEP. He takes Descovy every day with no issues or concerns. No problems with tolerability or getting it from [facility]. He has new insurance, [employer]. It appears that [facility] can still fill his Descovy but will follow up to make sure it does not need to be transferred to [facility]. He is currently not in a relationship and has multiple male partners. He gets his labs drawn at the [facility] since it is free. I have his faxed labs drawn on 12/16/21 - negative for HIV, Hepatitis C, syphilis, gonorrhea, and chlamydia. Will refill his Descovy today. Will set up a telephone office visit for next time as long as I get his lab work in time. He will go the beginning to middle of June to repeat testing. All questions answered.  ? ?Plan: ?- Refill Descovy x 3 months ?- Lab work at [facility] in mid June ?- F/up by telephone on 7/11 at 1130am ? ?Harry Gonzales, PharmD, BCIDP, AAHIVP, CPP ?Clinical Pharmacist Practitioner ?Infectious Diseases Clinical Pharmacist ?[facility] ?01/19/2022, 11:29 AM ? ?

## 2022-01-25 ENCOUNTER — Other Ambulatory Visit (HOSPITAL_COMMUNITY): Payer: Self-pay

## 2022-02-03 ENCOUNTER — Ambulatory Visit: Payer: BLUE CROSS/BLUE SHIELD | Admitting: Pharmacist

## 2022-02-10 ENCOUNTER — Other Ambulatory Visit (HOSPITAL_COMMUNITY): Payer: Self-pay

## 2022-02-14 ENCOUNTER — Other Ambulatory Visit (HOSPITAL_COMMUNITY): Payer: Self-pay

## 2022-02-24 ENCOUNTER — Other Ambulatory Visit (HOSPITAL_COMMUNITY): Payer: Self-pay

## 2022-03-18 ENCOUNTER — Other Ambulatory Visit (HOSPITAL_COMMUNITY): Payer: Self-pay

## 2022-03-24 ENCOUNTER — Other Ambulatory Visit (HOSPITAL_COMMUNITY): Payer: Self-pay

## 2022-04-12 ENCOUNTER — Other Ambulatory Visit (HOSPITAL_COMMUNITY): Payer: Self-pay

## 2022-04-14 ENCOUNTER — Telehealth: Payer: Self-pay

## 2022-04-14 ENCOUNTER — Other Ambulatory Visit (HOSPITAL_COMMUNITY): Payer: Self-pay

## 2022-04-14 ENCOUNTER — Other Ambulatory Visit: Payer: Self-pay | Admitting: Pharmacist

## 2022-04-14 DIAGNOSIS — Z7251 High risk heterosexual behavior: Secondary | ICD-10-CM

## 2022-04-14 NOTE — Telephone Encounter (Signed)
Patient called requesting refill of Descovy, says it was denied because he needs an appointment. He reports that he just had his labs done this morning at the [facility] cell center. Would like refill sent to [facility].  Sandie Ano, RN

## 2022-04-15 NOTE — Telephone Encounter (Signed)
We will wait until his labs are faxed to our office and then will send in more refills. We may not get the faxed results until later next week. He should still plan on seeing Cassie 7/11 for PrEP follow-up. Marchelle Folks

## 2022-04-21 ENCOUNTER — Other Ambulatory Visit (HOSPITAL_COMMUNITY): Payer: Self-pay

## 2022-04-21 ENCOUNTER — Other Ambulatory Visit: Payer: Self-pay | Admitting: Pharmacist

## 2022-04-21 DIAGNOSIS — Z7251 High risk heterosexual behavior: Secondary | ICD-10-CM

## 2022-04-21 MED ORDER — EMTRICITABINE-TENOFOVIR AF 200-25 MG PO TABS
1.0000 | ORAL_TABLET | Freq: Every day | ORAL | 2 refills | Status: DC
  Filled 2022-04-21: qty 30, 30d supply, fill #0
  Filled 2022-05-11: qty 30, 30d supply, fill #1
  Filled 2022-06-14: qty 30, 30d supply, fill #2

## 2022-04-21 NOTE — Telephone Encounter (Signed)
Spoke with patient. He had labs drawn last week but they have not resulted yet. He is going to run out of Descovy on Saturday. I will refill his Rx and wait for his labs to return. I will follow up with him again in October.   Harry Gonzales L. Harry Gonzales, PharmD [facility] Clinical Pharmacist Practitioner

## 2022-04-21 NOTE — Telephone Encounter (Signed)
Patient called back requesting to speak with Cassie about refills and appointment. Is scheduled to see pharmacy team on 7/11. Will forward message to Cassie, Pharmacist per patient request. Juanita Laster, RMA

## 2022-04-22 ENCOUNTER — Other Ambulatory Visit (HOSPITAL_COMMUNITY): Payer: Self-pay

## 2022-04-26 ENCOUNTER — Telehealth: Payer: Self-pay | Admitting: Pharmacist

## 2022-04-27 ENCOUNTER — Ambulatory Visit
Admission: EM | Admit: 2022-04-27 | Discharge: 2022-04-27 | Disposition: A | Discharge status: Home / Self Care | Payer: 59

## 2022-04-27 DIAGNOSIS — H43393 Other vitreous opacities, bilateral: Secondary | ICD-10-CM

## 2022-04-27 DIAGNOSIS — R030 Elevated blood-pressure reading, without diagnosis of hypertension: Secondary | ICD-10-CM | POA: Diagnosis not present

## 2022-04-27 NOTE — ED Triage Notes (Signed)
Pt presents with recurrent elevated blood pressure; pt reports intermittently seeing spots.

## 2022-04-27 NOTE — Discharge Instructions (Addendum)
Start checking your blood pressure at home and document when you are tired or relaxed bring in log to your primary provider with at least 2 weeks of blood pressures.  Blood pressure is supposed to go up when you are nervous or anxious but it should be controlled less than 140 over less than 90 more than 50% of the time.  Follow-up with the eye specialist for your floaters these are usually benign and do not affect your vision.  Return if you are worse or have new symptoms for reevaluation

## 2022-04-27 NOTE — ED Provider Notes (Signed)
[facility] - [facility]   MRN: [id number] DOB: 1971/03/30  Subjective:   Chief Complaint;  Chief Complaint  Patient presents with   Hypertension   Pt presents with recurrent elevated blood pressure; pt reports intermittently seeing spots.   Harry Gonzales is a 51 y.o. male presenting for 51 year old male presents to urgent care concerned with elevated blood pressure on home health evaluation today.  Patient also has multiple questions regarding gout, poor circulation, eye floaters.  No current facility-administered medications for this encounter.  Current Outpatient Medications:    acyclovir (ZOVIRAX) 400 MG tablet, Take 1 tablet (400 mg total) by mouth 2 (two) times daily., Disp: 50 tablet, Rfl: 0   emtricitabine-tenofovir AF (DESCOVY) 200-25 MG tablet, Take 1 tablet by mouth daily., Disp: 30 tablet, Rfl: 2   fenofibrate (TRICOR) 145 MG tablet, Take 1 tablet (145 mg total) by mouth daily., Disp: 30 tablet, Rfl: 0   pravastatin (PRAVACHOL) 40 MG tablet, Take 2 tablets (80 mg total) by mouth daily., Disp: 60 tablet, Rfl: 0   Allergies  Allergen Reactions   Codeine Nausea And Vomiting    Past Medical History:  Diagnosis Date   Hypercholesterolemia      Review of Systems  All other systems reviewed and are negative.    Objective:   Vitals: BP 118/83 (BP Location: Left Arm)   Pulse 74   Temp 98.1 F (36.7 C) (Oral)   Resp 18   SpO2 97%   Physical Exam Vitals and nursing note reviewed.  Constitutional:      General: He is not in acute distress.    Appearance: He is well-developed.  HENT:     Head: Normocephalic and atraumatic.  Eyes:     Conjunctiva/sclera: Conjunctivae normal.  Cardiovascular:     Rate and Rhythm: Normal rate and regular rhythm.     Heart sounds: No murmur heard. Pulmonary:     Effort: Pulmonary effort is normal. No respiratory distress.     Breath sounds: Normal breath sounds.  Abdominal:     Palpations: Abdomen is soft.      Tenderness: There is no abdominal tenderness.  Musculoskeletal:        General: No swelling.     Cervical back: Neck supple.  Skin:    General: Skin is warm and dry.     Capillary Refill: Capillary refill takes less than 2 seconds.  Neurological:     Mental Status: He is alert.  Psychiatric:        Mood and Affect: Mood normal.     No results found for this or any previous visit (from the past 24 hour(s)).  No results found.     Assessment and Plan :   1. Elevated blood pressure reading   2. Vitreous floaters of both eyes     No orders of the defined types were placed in this encounter.   MDM:  Harry Gonzales is a 51 y.o. male presenting for concerns for elevated blood pressure.  His exam is unremarkable and blood pressure is normal at 118/83.  His heart is regular no murmurs or abnormalities are noted.  I answered multiple questions regarding gout, eye floaters, elevated blood pressure and peripheral vascular disease.  Patient is encouraged to start taking home blood pressures and log them to bring in for review with his primary care care provider in the next 2 to 3 weeks.  Patient is agreeable to this plan.  I discussed todays findings, treatment plan,  follow up and return instructions. Questions were answered. Patient/representative stated understanding of the instructions and patient is stable for discharge.  Dewaine Conger FNP-C MSN    Harry Baseman, NP 04/27/22 1715

## 2022-04-29 ENCOUNTER — Telehealth: Payer: Self-pay | Admitting: Pharmacist

## 2022-04-29 NOTE — Telephone Encounter (Signed)
Received patient's labs - negative HIV antibody and all STI results were negative. Patient is on Descovy for HIV PrEP. Refills already sent.  Bear Osten L. Matt Delpizzo, PharmD, BCIDP, AAHIVP, CPP Clinical Pharmacist Practitioner Infectious Diseases Clinical Pharmacist [facility] 04/29/2022, 11:46 AM

## 2022-05-10 ENCOUNTER — Telehealth: Payer: Self-pay

## 2022-05-10 NOTE — Telephone Encounter (Signed)
Patient placed call to triage requesting to speak to pharmacy [facility]). Advised patient that she is curently seeing patients at this time, but staff would be glad to send to message for pharmacy team to return patient's call. Patient also advised to utilize Mychart if unable to get through phone lines. Valarie Cones

## 2022-05-10 NOTE — Telephone Encounter (Signed)
Thank you :)

## 2022-05-11 ENCOUNTER — Other Ambulatory Visit (HOSPITAL_COMMUNITY): Payer: Self-pay

## 2022-05-20 ENCOUNTER — Other Ambulatory Visit (HOSPITAL_COMMUNITY): Payer: Self-pay

## 2022-06-07 ENCOUNTER — Other Ambulatory Visit (HOSPITAL_COMMUNITY): Payer: Self-pay

## 2022-06-14 ENCOUNTER — Other Ambulatory Visit (HOSPITAL_COMMUNITY): Payer: Self-pay

## 2022-06-16 ENCOUNTER — Other Ambulatory Visit (HOSPITAL_COMMUNITY): Payer: Self-pay

## 2022-07-11 ENCOUNTER — Other Ambulatory Visit: Payer: Self-pay | Admitting: Pharmacist

## 2022-07-11 ENCOUNTER — Other Ambulatory Visit (HOSPITAL_COMMUNITY): Payer: Self-pay

## 2022-07-11 DIAGNOSIS — Z7251 High risk heterosexual behavior: Secondary | ICD-10-CM

## 2022-07-12 ENCOUNTER — Other Ambulatory Visit (HOSPITAL_COMMUNITY): Payer: Self-pay

## 2022-07-13 ENCOUNTER — Other Ambulatory Visit (HOSPITAL_COMMUNITY): Payer: Self-pay

## 2022-07-22 ENCOUNTER — Other Ambulatory Visit (HOSPITAL_COMMUNITY): Payer: Self-pay

## 2022-07-26 ENCOUNTER — Other Ambulatory Visit (HOSPITAL_COMMUNITY): Payer: Self-pay

## 2022-07-26 ENCOUNTER — Telehealth: Payer: Self-pay | Admitting: Pharmacist

## 2022-07-26 DIAGNOSIS — Z7251 High risk heterosexual behavior: Secondary | ICD-10-CM

## 2022-07-26 MED ORDER — EMTRICITABINE-TENOFOVIR AF 200-25 MG PO TABS
1.0000 | ORAL_TABLET | Freq: Every day | ORAL | 2 refills | Status: DC
Start: 2022-07-26 — End: 2022-10-21
  Filled 2022-07-26: qty 30, 30d supply, fill #0
  Filled 2022-08-16: qty 30, 30d supply, fill #1
  Filled 2022-09-14 – 2022-09-20 (×2): qty 30, 30d supply, fill #2

## 2022-07-26 NOTE — Telephone Encounter (Signed)
Received labs from [facility] Department. Negative STI testing and negative HIV antibody from 07/14/22. Will send in refills of Descovy.  Maelie Chriswell L. Rei Medlen, PharmD, BCIDP, AAHIVP, CPP Clinical Pharmacist Practitioner Infect[facility] 07/26/2022, 2:45 PM

## 2022-07-27 ENCOUNTER — Other Ambulatory Visit (HOSPITAL_COMMUNITY): Payer: Self-pay

## 2022-08-16 ENCOUNTER — Other Ambulatory Visit (HOSPITAL_COMMUNITY): Payer: Self-pay

## 2022-08-19 ENCOUNTER — Other Ambulatory Visit (HOSPITAL_COMMUNITY): Payer: Self-pay

## 2022-09-14 ENCOUNTER — Other Ambulatory Visit (HOSPITAL_COMMUNITY): Payer: Self-pay

## 2022-09-16 DIAGNOSIS — R69 Illness, unspecified: Secondary | ICD-10-CM | POA: Diagnosis not present

## 2022-09-16 DIAGNOSIS — U071 COVID-19: Secondary | ICD-10-CM | POA: Diagnosis not present

## 2022-09-20 ENCOUNTER — Other Ambulatory Visit: Payer: Self-pay

## 2022-09-20 ENCOUNTER — Other Ambulatory Visit (HOSPITAL_COMMUNITY): Payer: Self-pay

## 2022-09-28 DIAGNOSIS — Z0000 Encounter for general adult medical examination without abnormal findings: Secondary | ICD-10-CM | POA: Diagnosis not present

## 2022-09-28 DIAGNOSIS — E7800 Pure hypercholesterolemia, unspecified: Secondary | ICD-10-CM | POA: Diagnosis not present

## 2022-10-04 DIAGNOSIS — B9689 Other specified bacterial agents as the cause of diseases classified elsewhere: Secondary | ICD-10-CM | POA: Diagnosis not present

## 2022-10-04 DIAGNOSIS — J208 Acute bronchitis due to other specified organisms: Secondary | ICD-10-CM | POA: Diagnosis not present

## 2022-10-11 ENCOUNTER — Other Ambulatory Visit (HOSPITAL_COMMUNITY): Payer: Self-pay

## 2022-10-12 ENCOUNTER — Other Ambulatory Visit (HOSPITAL_COMMUNITY): Payer: Self-pay

## 2022-10-14 ENCOUNTER — Other Ambulatory Visit (HOSPITAL_COMMUNITY): Payer: Self-pay

## 2022-10-18 DIAGNOSIS — R69 Illness, unspecified: Secondary | ICD-10-CM | POA: Diagnosis not present

## 2022-10-18 DIAGNOSIS — Z113 Encounter for screening for infections with a predominantly sexual mode of transmission: Secondary | ICD-10-CM | POA: Diagnosis not present

## 2022-10-21 ENCOUNTER — Other Ambulatory Visit (HOSPITAL_COMMUNITY): Payer: Self-pay

## 2022-10-21 ENCOUNTER — Other Ambulatory Visit: Payer: Self-pay

## 2022-10-21 ENCOUNTER — Other Ambulatory Visit: Payer: Self-pay | Admitting: Pharmacist

## 2022-10-21 DIAGNOSIS — Z7251 High risk heterosexual behavior: Secondary | ICD-10-CM

## 2022-10-21 MED ORDER — EMTRICITABINE-TENOFOVIR AF 200-25 MG PO TABS
1.0000 | ORAL_TABLET | Freq: Every day | ORAL | 2 refills | Status: DC
  Filled 2022-10-21: qty 30, 30d supply, fill #0
  Filled 2022-11-09: qty 30, 30d supply, fill #1
  Filled 2022-12-06: qty 30, 30d supply, fill #2

## 2022-10-21 NOTE — Progress Notes (Signed)
  Receive patient's labs today - negative HIV antibody on 10/18/22 (picture in media tab). Patient is on Descovy for HIV PrEP. Refills sent to [facility] today.  Alfonse Spruce, PharmD, CPP, BCIDP, [place] Clinical Pharmacist Practitioner [facility]

## 2022-11-09 ENCOUNTER — Other Ambulatory Visit (HOSPITAL_COMMUNITY): Payer: Self-pay

## 2022-11-14 DIAGNOSIS — R030 Elevated blood-pressure reading, without diagnosis of hypertension: Secondary | ICD-10-CM | POA: Diagnosis not present

## 2022-11-14 DIAGNOSIS — Z0000 Encounter for general adult medical examination without abnormal findings: Secondary | ICD-10-CM | POA: Diagnosis not present

## 2022-11-14 DIAGNOSIS — E7800 Pure hypercholesterolemia, unspecified: Secondary | ICD-10-CM | POA: Diagnosis not present

## 2022-11-14 DIAGNOSIS — R69 Illness, unspecified: Secondary | ICD-10-CM | POA: Diagnosis not present

## 2022-11-16 ENCOUNTER — Other Ambulatory Visit (HOSPITAL_COMMUNITY): Payer: Self-pay

## 2022-12-06 ENCOUNTER — Other Ambulatory Visit (HOSPITAL_COMMUNITY): Payer: Self-pay

## 2022-12-09 ENCOUNTER — Other Ambulatory Visit (HOSPITAL_COMMUNITY): Payer: Self-pay

## 2022-12-12 ENCOUNTER — Other Ambulatory Visit: Payer: Self-pay

## 2022-12-22 DIAGNOSIS — F902 Attention-deficit hyperactivity disorder, combined type: Secondary | ICD-10-CM | POA: Diagnosis not present

## 2022-12-29 ENCOUNTER — Other Ambulatory Visit (HOSPITAL_COMMUNITY): Payer: Self-pay

## 2023-01-04 ENCOUNTER — Other Ambulatory Visit: Payer: Self-pay | Admitting: Pharmacist

## 2023-01-04 ENCOUNTER — Other Ambulatory Visit (HOSPITAL_COMMUNITY): Payer: Self-pay

## 2023-01-04 DIAGNOSIS — Z7251 High risk heterosexual behavior: Secondary | ICD-10-CM

## 2023-01-12 ENCOUNTER — Other Ambulatory Visit (HOSPITAL_COMMUNITY): Payer: Self-pay

## 2023-01-20 ENCOUNTER — Other Ambulatory Visit (HOSPITAL_COMMUNITY): Payer: Self-pay

## 2023-01-25 ENCOUNTER — Other Ambulatory Visit (HOSPITAL_COMMUNITY): Payer: Self-pay

## 2023-01-26 ENCOUNTER — Telehealth: Payer: Self-pay | Admitting: Pharmacist

## 2023-01-26 ENCOUNTER — Other Ambulatory Visit (HOSPITAL_COMMUNITY): Payer: Self-pay

## 2023-01-26 ENCOUNTER — Other Ambulatory Visit: Payer: Self-pay

## 2023-01-26 DIAGNOSIS — Z7251 High risk heterosexual behavior: Secondary | ICD-10-CM

## 2023-01-26 MED ORDER — EMTRICITABINE-TENOFOVIR AF 200-25 MG PO TABS
1.0000 | ORAL_TABLET | Freq: Every day | ORAL | 2 refills | Status: DC
  Filled 2023-01-26 (×2): qty 30, 30d supply, fill #0
  Filled 2023-02-16: qty 30, 30d supply, fill #1
  Filled 2023-03-21: qty 30, 30d supply, fill #2

## 2023-01-26 NOTE — Telephone Encounter (Signed)
Received lab work from health department showing negative RPR, urine cytology, Hepatitis C antibody, and HIV antibody from 01/05/23. Will refill patient's Descovy. Results located under media section.  Tyera Hansley L. Kinney Sackmann, PharmD, BCIDP, AAHIVP, CPP Clinical Pharmacist Practitioner Infectious Diseases Clinical Pharmacist [facility] 01/26/2023, 3:43 PM

## 2023-01-27 ENCOUNTER — Other Ambulatory Visit (HOSPITAL_COMMUNITY): Payer: Self-pay

## 2023-01-28 ENCOUNTER — Other Ambulatory Visit (HOSPITAL_COMMUNITY): Payer: Self-pay

## 2023-02-14 ENCOUNTER — Other Ambulatory Visit (HOSPITAL_COMMUNITY): Payer: Self-pay

## 2023-02-16 ENCOUNTER — Other Ambulatory Visit (HOSPITAL_COMMUNITY): Payer: Self-pay

## 2023-02-22 ENCOUNTER — Other Ambulatory Visit: Payer: Self-pay

## 2023-03-14 ENCOUNTER — Other Ambulatory Visit: Payer: Self-pay

## 2023-03-19 DIAGNOSIS — R300 Dysuria: Secondary | ICD-10-CM | POA: Diagnosis not present

## 2023-03-19 DIAGNOSIS — Z113 Encounter for screening for infections with a predominantly sexual mode of transmission: Secondary | ICD-10-CM | POA: Diagnosis not present

## 2023-03-21 ENCOUNTER — Other Ambulatory Visit (HOSPITAL_COMMUNITY): Payer: Self-pay

## 2023-03-24 DIAGNOSIS — E7800 Pure hypercholesterolemia, unspecified: Secondary | ICD-10-CM | POA: Diagnosis not present

## 2023-03-24 DIAGNOSIS — R300 Dysuria: Secondary | ICD-10-CM | POA: Diagnosis not present

## 2023-03-24 DIAGNOSIS — F902 Attention-deficit hyperactivity disorder, combined type: Secondary | ICD-10-CM | POA: Diagnosis not present

## 2023-03-30 ENCOUNTER — Telehealth: Payer: Self-pay

## 2023-03-30 NOTE — Telephone Encounter (Signed)
Patient called office requesting to speak with Cassie, Pharmacist regarding his care with office. States that his insurance plan lapsed and is trying to reinstate it, but insurance company needs clarification on why he was taking Truvada.  Needs to show proof that he was on Truvada for prep and not for treatment. Also would like to know when he first started. Patient states that he was part of trial.  Is requesting staff speak with his case manger or insurance company to discuss this. Informed him that we are not able to do this without medical release.  Will come by office to fill this out. Requested he also contact insurance company to find out what all does insurance company need from office to help resolve insurance issue.  Would like to speak with Cassie when she returns to office. Will forward note. Juanita Laster, RMA

## 2023-03-31 NOTE — Telephone Encounter (Signed)
Thanks Cece. I'll follow up with him on Monday.

## 2023-04-04 DIAGNOSIS — Z6830 Body mass index (BMI) 30.0-30.9, adult: Secondary | ICD-10-CM | POA: Diagnosis not present

## 2023-04-04 DIAGNOSIS — J309 Allergic rhinitis, unspecified: Secondary | ICD-10-CM | POA: Diagnosis not present

## 2023-04-04 DIAGNOSIS — K219 Gastro-esophageal reflux disease without esophagitis: Secondary | ICD-10-CM | POA: Diagnosis not present

## 2023-04-04 DIAGNOSIS — F909 Attention-deficit hyperactivity disorder, unspecified type: Secondary | ICD-10-CM | POA: Diagnosis not present

## 2023-04-04 DIAGNOSIS — E785 Hyperlipidemia, unspecified: Secondary | ICD-10-CM | POA: Diagnosis not present

## 2023-04-04 DIAGNOSIS — B0089 Other herpesviral infection: Secondary | ICD-10-CM | POA: Diagnosis not present

## 2023-04-04 DIAGNOSIS — E669 Obesity, unspecified: Secondary | ICD-10-CM | POA: Diagnosis not present

## 2023-04-04 DIAGNOSIS — Z885 Allergy status to narcotic agent status: Secondary | ICD-10-CM | POA: Diagnosis not present

## 2023-04-04 DIAGNOSIS — M1990 Unspecified osteoarthritis, unspecified site: Secondary | ICD-10-CM | POA: Diagnosis not present

## 2023-04-05 DIAGNOSIS — R072 Precordial pain: Secondary | ICD-10-CM | POA: Diagnosis not present

## 2023-04-12 ENCOUNTER — Other Ambulatory Visit (HOSPITAL_COMMUNITY): Payer: Self-pay

## 2023-04-13 DIAGNOSIS — R072 Precordial pain: Secondary | ICD-10-CM | POA: Diagnosis not present

## 2023-04-26 ENCOUNTER — Other Ambulatory Visit (HOSPITAL_COMMUNITY): Payer: Self-pay

## 2023-04-27 ENCOUNTER — Other Ambulatory Visit: Payer: Self-pay | Admitting: Pharmacist

## 2023-04-27 ENCOUNTER — Other Ambulatory Visit (HOSPITAL_COMMUNITY): Payer: Self-pay

## 2023-04-27 DIAGNOSIS — Z7251 High risk heterosexual behavior: Secondary | ICD-10-CM

## 2023-05-03 ENCOUNTER — Other Ambulatory Visit (HOSPITAL_COMMUNITY): Payer: Self-pay

## 2023-05-04 ENCOUNTER — Other Ambulatory Visit (HOSPITAL_COMMUNITY): Payer: Self-pay

## 2023-05-08 ENCOUNTER — Other Ambulatory Visit: Payer: Self-pay

## 2023-05-11 ENCOUNTER — Other Ambulatory Visit (HOSPITAL_COMMUNITY): Payer: Self-pay

## 2023-05-25 ENCOUNTER — Other Ambulatory Visit (HOSPITAL_COMMUNITY): Payer: Self-pay

## 2023-06-01 ENCOUNTER — Telehealth: Payer: Self-pay | Admitting: Pharmacist

## 2023-06-01 NOTE — Telephone Encounter (Signed)
Patient came in around 5pm earlier this week requesting information for insurance company regarding PrEP. I have not prescribed PrEP since April. Refill records state that he hasn't filled since June. If patient calls or comes back in, please have him schedule with a provider to discuss this and fill out necessary paperwork as a provider will be needed for insurance company.   Harlo Fabela L. Matina Rodier, PharmD, BCIDP, AAHIVP, CPP Clinical Pharmacist Practitioner Infectious Diseases Clinical Pharmacist [facility] 06/01/2023, 4:57 PM

## 2023-06-02 NOTE — Telephone Encounter (Signed)
Patient will come to the office on 06/05/23 to speak with Cassie about what he needs for the insurance.  Harry Gonzales

## 2023-06-04 NOTE — Progress Notes (Incomplete)
Date:  06/04/2023   HPI: Harry Gonzales is a 52 y.o. male who presents to the [facility] pharmacy clinic for HIV PrEP follow-up.  Insured   [x]    Uninsured  []    Patient Active Problem List   Diagnosis Date Noted   High risk sexual behavior 12/22/2020    Patient's Medications  New Prescriptions   No medications on file  Previous Medications   ACYCLOVIR (ZOVIRAX) 400 MG TABLET    Take 1 tablet (400 mg total) by mouth 2 (two) times daily.   EMTRICITABINE-TENOFOVIR AF (DESCOVY) 200-25 MG TABLET    Take 1 tablet by mouth daily.   FENOFIBRATE (TRICOR) 145 MG TABLET    Take 1 tablet (145 mg total) by mouth daily.   PRAVASTATIN (PRAVACHOL) 40 MG TABLET    Take 2 tablets (80 mg total) by mouth daily.  Modified Medications   No medications on file  Discontinued Medications   No medications on file       07/09/2019    9:51 AM 03/26/2019   11:37 AM 11/07/2018   11:37 AM 07/02/2018    3:03 PM 07/02/2018    2:39 PM 02/21/2018    2:11 PM 11/24/2017   11:15 AM  CH[facility] HIV PREP FLOWSHEET RESULTS  Insurance Status Insured Insured Insured Uninsured Uninsured Uninsured Uninsured  How did you hear?     radio  [facility]  Gender at birth Male Male Male Male Male Male Male  Gender identity cis-Male cis-Male cis-Male cis-Male  cis-Male cis-Male  Risk for HIV  >5 partners in past 6 mos (regardless of condom use);Condomless vaginal or anal intercourse Condomless vaginal or anal intercourse;>5 partners in past 6 mos (regardless of condom use) Condomless vaginal or anal intercourse;>5 partners in past 6 mos (regardless of condom use)  >5 partners in past 6 mos (regardless of condom use);Hx of STI >5 partners in past 6 mos (regardless of condom use);Condomless vaginal or anal intercourse  Sex Partners Women only Women only Women only Women only  Women only Women only  # sex partners past 3-6 mos  1-3 4-6 4-6  1-3 4-6  Sex activity preferences  Insertive Insertive Insertive  Insertive Insertive  Condom use  Yes  Yes Yes  No Yes  % condom use  80 70 45   99  Treated for STI?  No No No  No   HIV symptoms? N/A N/A N/A N/A  N/A N/A  PrEP Eligibility CrCl >60 ml/min Substantial risk for HIV HIV negative;CrCl >60 ml/min        Labs:  SCr: Lab Results  Component Value Date   CREATININE 1.44 (H) 11/07/2018   CREATININE 1.21 07/02/2018   CREATININE 0.98 02/21/2018   CREATININE 1.06 11/24/2017   CREATININE 1.10 02/23/2017   HIV Lab Results  Component Value Date   HIV NON-REACTIVE 11/07/2018   HIV NON-REACTIVE 07/02/2018   HIV NON-REACTIVE 02/21/2018   HIV NON-REACTIVE 11/24/2017   Hepatitis B Lab Results  Component Value Date   HEPBSAB REACTIVE (A) 11/24/2017   HEPBSAG NON-REACTIVE 11/24/2017   Hepatitis C No results found for: "HEPCAB", "HCVRNAPCRQN" Hepatitis A No results found for: "HAV" RPR and STI Lab Results  Component Value Date   LABRPR NON-REACTIVE 11/07/2018   LABRPR NON-REACTIVE 11/24/2017    STI Results GC CT  11/07/2018 12:00 AM Negative  Negative   11/24/2017 12:00 AM Negative  Negative     Assessment: ***  Plan: ***  Lennie Muckle, PharmD PGY1 Pharmacy Resident 06/04/2023  2:44 PM

## 2023-06-05 ENCOUNTER — Other Ambulatory Visit: Payer: Self-pay

## 2023-06-05 ENCOUNTER — Ambulatory Visit (INDEPENDENT_AMBULATORY_CARE_PROVIDER_SITE_OTHER): Payer: 59 | Admitting: Pharmacist

## 2023-06-05 DIAGNOSIS — Z2981 Encounter for HIV pre-exposure prophylaxis: Secondary | ICD-10-CM

## 2023-06-05 DIAGNOSIS — Z79899 Other long term (current) drug therapy: Secondary | ICD-10-CM

## 2023-06-06 ENCOUNTER — Other Ambulatory Visit: Payer: Self-pay | Admitting: Pharmacist

## 2023-06-06 ENCOUNTER — Other Ambulatory Visit (HOSPITAL_COMMUNITY): Payer: Self-pay

## 2023-06-06 ENCOUNTER — Other Ambulatory Visit: Payer: Self-pay

## 2023-06-06 DIAGNOSIS — Z7251 High risk heterosexual behavior: Secondary | ICD-10-CM

## 2023-06-06 MED ORDER — EMTRICITABINE-TENOFOVIR AF 200-25 MG PO TABS
1.0000 | ORAL_TABLET | Freq: Every day | ORAL | 2 refills | Status: AC
Start: 2023-06-06 — End: ?
  Filled 2023-06-06: qty 30, 30d supply, fill #0
  Filled 2023-06-26: qty 30, 30d supply, fill #1
  Filled 2023-07-25: qty 30, 30d supply, fill #2

## 2023-06-06 NOTE — Progress Notes (Signed)
Date:  06/06/2023   HPI: Harry Gonzales is a 52 y.o. male who presents to the [facility] pharmacy clinic for HIV PrEP follow-up.  Insured   [x]    Uninsured  []    Patient Active Problem List   Diagnosis Date Noted   High risk sexual behavior 12/22/2020    Patient's Medications  New Prescriptions   No medications on file  Previous Medications   ACYCLOVIR (ZOVIRAX) 400 MG TABLET    Take 1 tablet (400 mg total) by mouth 2 (two) times daily.   FENOFIBRATE (TRICOR) 145 MG TABLET    Take 1 tablet (145 mg total) by mouth daily.   PRAVASTATIN (PRAVACHOL) 40 MG TABLET    Take 2 tablets (80 mg total) by mouth daily.  Modified Medications   Modified Medication Previous Medication   EMTRICITABINE-TENOFOVIR AF (DESCOVY) 200-25 MG TABLET emtricitabine-tenofovir AF (DESCOVY) 200-25 MG tablet      Take 1 tablet by mouth daily.    Take 1 tablet by mouth daily.  Discontinued Medications   No medications on file       07/09/2019    9:51 AM 03/26/2019   11:37 AM 11/07/2018   11:37 AM 07/02/2018    3:03 PM 07/02/2018    2:39 PM 02/21/2018    2:11 PM 11/24/2017   11:15 AM  CHL HIV PREP FLOWSHEET RESULTS  Insurance Status Insured Insured Insured Uninsured Uninsured Uninsured Uninsured  How did you hear?     radio  [facility]  Gender at birth Male Male Male Male Male Male Male  Gender identity cis-Male cis-Male cis-Male cis-Male  cis-Male cis-Male  Risk for HIV  >5 partners in past 6 mos (regardless of condom use);Condomless vaginal or anal intercourse Condomless vaginal or anal intercourse;>5 partners in past 6 mos (regardless of condom use) Condomless vaginal or anal intercourse;>5 partners in past 6 mos (regardless of condom use)  >5 partners in past 6 mos (regardless of condom use);Hx of STI >5 partners in past 6 mos (regardless of condom use);Condomless vaginal or anal intercourse  Sex Partners Women only Women only Women only Women only  Women only Women only  # sex partners past 3-6 mos  1-3 4-6 4-6   1-3 4-6  Sex activity preferences  Insertive Insertive Insertive  Insertive Insertive  Condom use  Yes Yes Yes  No Yes  % condom use  80 70 45   99  Treated for STI?  No No No  No   HIV symptoms? N/A N/A N/A N/A  N/A N/A  PrEP Eligibility CrCl >60 ml/min Substantial risk for HIV HIV negative;CrCl >60 ml/min        Labs:  SCr: Lab Results  Component Value Date   CREATININE 1.44 (H) 11/07/2018   CREATININE 1.21 07/02/2018   CREATININE 0.98 02/21/2018   CREATININE 1.06 11/24/2017   CREATININE 1.10 02/23/2017   HIV Lab Results  Component Value Date   HIV NON-REACTIVE 11/07/2018   HIV NON-REACTIVE 07/02/2018   HIV NON-REACTIVE 02/21/2018   HIV NON-REACTIVE 11/24/2017   Hepatitis B Lab Results  Component Value Date   HEPBSAB REACTIVE (A) 11/24/2017   HEPBSAG NON-REACTIVE 11/24/2017   Hepatitis C No results found for: "HEPCAB", "HCVRNAPCRQN" Hepatitis A No results found for: "HAV" RPR and STI Lab Results  Component Value Date   LABRPR NON-REACTIVE 11/07/2018   LABRPR NON-REACTIVE 11/24/2017    STI Results GC CT  11/07/2018 12:00 AM Negative  Negative   11/24/2017 12:00 AM Negative  Negative  Assessment: Harry Gonzales is here today for Pre-exposure prophylaxis (PrEP) follow up. He comes in today and states that his insurance has dropped him recently because of the PrEP medication he is on. Let me be clear --  Harry Gonzales has NEVER tested positive for HIV. He has been on Descovy since 2019 to PREVENT HIV and DOES NOT have HIV infection. He is willing to stop PrEP if his insurance will reinstate him but that would be a shame as he is taking this as a PREVENTATIVE measure.  He had labs drawn at the [facility] a few weeks ago and will have them send me a copy.   Plan: - Refill Descovy if negative for HIV - Follow up in 3 months  Nichols Corter L. Kaylie Ritter, PharmD, BCIDP, AAHIVP, CPP Clinical Pharmacist Practitioner Infectious Diseases Clinical Pharmacist [facility] 06/06/2023, 11:12 AM

## 2023-06-07 ENCOUNTER — Other Ambulatory Visit: Payer: Self-pay

## 2023-06-26 ENCOUNTER — Other Ambulatory Visit (HOSPITAL_COMMUNITY): Payer: Self-pay

## 2023-06-26 DIAGNOSIS — F902 Attention-deficit hyperactivity disorder, combined type: Secondary | ICD-10-CM | POA: Diagnosis not present

## 2023-07-09 DIAGNOSIS — M5136 Other intervertebral disc degeneration, lumbar region: Secondary | ICD-10-CM | POA: Diagnosis not present

## 2023-07-09 DIAGNOSIS — M545 Low back pain, unspecified: Secondary | ICD-10-CM | POA: Diagnosis not present

## 2023-07-09 DIAGNOSIS — M25461 Effusion, right knee: Secondary | ICD-10-CM | POA: Diagnosis not present

## 2023-07-09 DIAGNOSIS — M25561 Pain in right knee: Secondary | ICD-10-CM | POA: Diagnosis not present

## 2023-07-25 ENCOUNTER — Other Ambulatory Visit (HOSPITAL_COMMUNITY): Payer: Self-pay | Admitting: Pharmacy Technician

## 2023-07-25 ENCOUNTER — Other Ambulatory Visit (HOSPITAL_COMMUNITY): Payer: Self-pay

## 2023-07-25 NOTE — Progress Notes (Signed)
Specialty Pharmacy Refill Coordination Note  Harry Gonzales is a 52 y.o. male contacted today regarding refills of specialty medication(s) Emtricitabine-Tenofovir Af   Patient requested Delivery   Delivery date: 07/28/23   Verified address: [address] [place]   Medication will be filled on 07/27/23.

## 2023-08-17 ENCOUNTER — Other Ambulatory Visit: Payer: Self-pay | Admitting: Pharmacist

## 2023-08-17 ENCOUNTER — Other Ambulatory Visit (HOSPITAL_COMMUNITY): Payer: Self-pay | Admitting: Pharmacy Technician

## 2023-08-17 ENCOUNTER — Other Ambulatory Visit (HOSPITAL_COMMUNITY): Payer: Self-pay

## 2023-08-17 DIAGNOSIS — Z7251 High risk heterosexual behavior: Secondary | ICD-10-CM

## 2023-08-17 NOTE — Progress Notes (Signed)
Specialty Pharmacy Refill Coordination Note  Harry Gonzales is a 52 y.o. male contacted today regarding refills of specialty medication(s) Emtricitabine-Tenofovir Af   Patient requested Delivery   Delivery date: 08/25/23   Verified address: [address] [place]   Medication will be filled on 08/24/23.  Refill Request sent to Md; call if any delays

## 2023-08-18 ENCOUNTER — Other Ambulatory Visit (HOSPITAL_COMMUNITY): Payer: Self-pay

## 2023-08-18 NOTE — Progress Notes (Signed)
Refill request was denied by needs an appointment, patient is aware

## 2023-08-22 ENCOUNTER — Other Ambulatory Visit (HOSPITAL_COMMUNITY): Payer: Self-pay

## 2023-08-28 ENCOUNTER — Other Ambulatory Visit: Payer: Self-pay

## 2023-08-30 ENCOUNTER — Other Ambulatory Visit: Payer: Self-pay

## 2023-09-04 ENCOUNTER — Other Ambulatory Visit (HOSPITAL_COMMUNITY): Payer: Self-pay

## 2023-09-11 ENCOUNTER — Other Ambulatory Visit (HOSPITAL_COMMUNITY): Payer: Self-pay

## 2023-09-12 ENCOUNTER — Other Ambulatory Visit (HOSPITAL_COMMUNITY): Payer: Self-pay

## 2023-09-19 DIAGNOSIS — R0981 Nasal congestion: Secondary | ICD-10-CM | POA: Diagnosis not present

## 2023-09-19 DIAGNOSIS — Z20822 Contact with and (suspected) exposure to covid-19: Secondary | ICD-10-CM | POA: Diagnosis not present

## 2023-09-19 DIAGNOSIS — J101 Influenza due to other identified influenza virus with other respiratory manifestations: Secondary | ICD-10-CM | POA: Diagnosis not present

## 2023-09-19 DIAGNOSIS — R6883 Chills (without fever): Secondary | ICD-10-CM | POA: Diagnosis not present

## 2023-09-21 ENCOUNTER — Other Ambulatory Visit: Payer: Self-pay

## 2023-10-04 DIAGNOSIS — E7800 Pure hypercholesterolemia, unspecified: Secondary | ICD-10-CM | POA: Diagnosis not present

## 2023-10-04 DIAGNOSIS — F902 Attention-deficit hyperactivity disorder, combined type: Secondary | ICD-10-CM | POA: Diagnosis not present

## 2023-10-04 DIAGNOSIS — R030 Elevated blood-pressure reading, without diagnosis of hypertension: Secondary | ICD-10-CM | POA: Diagnosis not present

## 2023-11-02 ENCOUNTER — Other Ambulatory Visit: Payer: Self-pay

## 2023-11-07 ENCOUNTER — Emergency Department (HOSPITAL_BASED_OUTPATIENT_CLINIC_OR_DEPARTMENT_OTHER): Payer: Self-pay | Admitting: Radiology

## 2023-11-07 ENCOUNTER — Other Ambulatory Visit: Payer: Self-pay

## 2023-11-07 ENCOUNTER — Emergency Department (HOSPITAL_BASED_OUTPATIENT_CLINIC_OR_DEPARTMENT_OTHER)
Admission: EM | Admit: 2023-11-07 | Discharge: 2023-11-07 | Disposition: A | Discharge status: Home / Self Care | Payer: Self-pay | Attending: Emergency Medicine | Admitting: Emergency Medicine

## 2023-11-07 DIAGNOSIS — R0789 Other chest pain: Secondary | ICD-10-CM | POA: Insufficient documentation

## 2023-11-07 DIAGNOSIS — R079 Chest pain, unspecified: Secondary | ICD-10-CM | POA: Diagnosis present

## 2023-11-07 LAB — TROPONIN I (HIGH SENSITIVITY)
Troponin I (High Sensitivity): 3 ng/L (ref <18)
Troponin I (High Sensitivity): 3 ng/L (ref <18)

## 2023-11-07 LAB — BASIC METABOLIC PANEL
Anion gap: 9 (ref 5–15)
BUN: 14 mg/dL (ref 6–20)
CO2: 25 mmol/L (ref 22–32)
Calcium: 10.1 mg/dL (ref 8.9–10.3)
Chloride: 102 mmol/L (ref 98–111)
Creatinine, Ser: 1.01 mg/dL (ref 0.61–1.24)
GFR, Estimated: >60 mL/min (ref >60)
Glucose, Bld: 92 mg/dL (ref 70–99)
Potassium: 3.7 mmol/L (ref 3.5–5.1)
Sodium: 136 mmol/L (ref 135–145)

## 2023-11-07 LAB — CBC
HCT: 42.1 % (ref 39.0–52.0)
Hemoglobin: 14.4 g/dL (ref 13.0–17.0)
MCH: 33.3 pg (ref 26.0–34.0)
MCHC: 34.2 g/dL (ref 30.0–36.0)
MCV: 97.2 fL (ref 80.0–100.0)
Platelets: 209 10*3/uL (ref 150–400)
RBC: 4.33 MIL/uL (ref 4.22–5.81)
RDW: 12.4 % (ref 11.5–15.5)
WBC: 7.6 10*3/uL (ref 4.0–10.5)
nRBC: 0 % (ref 0.0–0.2)

## 2023-11-07 NOTE — ED Triage Notes (Signed)
C/o central CP rads into left arm starting today around 1700. Denies cardiac hx. Denies SHOB or dizziness.

## 2023-11-07 NOTE — Discharge Instructions (Addendum)
You were evaluated in the emergency room for chest pain.  Your lab work, EKG and chest x-ray were all unremarkable.  I did recommend you follow-up with your primary care provider within the next 5 days to ensure your symptoms are improving.  If you experience any new or worsening symptoms including worsening chest pain, shortness of breath, dizziness, nausea and vomiting please return to the emergency room.

## 2023-11-07 NOTE — ED Provider Notes (Signed)
[facility] EMERGENCY DEPARTMENT AT [facility] Provider Note   CSN: [id number] Arrival date & time: 11/07/23  1759     History  Chief Complaint  Patient presents with   Chest Pain    Harry Gonzales is a 53 y.o. male presents with central substernal chest pain that started this afternoon.  Lasted for approximately 30 minutes with intermittent recurrence throughout his stay here.  Denies any associated shortness of breath, vomiting or dizziness.  He has no personal cardiac history but was concerned as his father and brother both have heart attacks.  No history of blood clots.  No recent surgeries or extended travel.  Denies any lower extremity swelling or pain.  No exertional component.   Chest Pain     Past Medical History:  Diagnosis Date   Hypercholesterolemia      Home Medications Prior to Admission medications   Medication Sig Start Date End Date Taking? Authorizing Provider  acyclovir (ZOVIRAX) 400 MG tablet Take 1 tablet (400 mg total) by mouth 2 (two) times daily. 10/22/18   Wynetta Fines, MD  emtricitabine-tenofovir AF (DESCOVY) 200-25 MG tablet Take 1 tablet by mouth daily. 06/06/23   Kuppelweiser, Cassie L, RPH-CPP  fenofibrate (TRICOR) 145 MG tablet Take 1 tablet (145 mg total) by mouth daily. 10/22/18 11/21/18  Wynetta Fines, MD  pravastatin (PRAVACHOL) 40 MG tablet Take 2 tablets (80 mg total) by mouth daily. 10/22/18 11/21/18  Wynetta Fines, MD      Allergies    Codeine    Review of Systems   Review of Systems  Cardiovascular:  Positive for chest pain.    Physical Exam Updated Vital Signs BP 121/76   Pulse 76   Temp 98.5 F (36.9 C) (Oral)   Resp 17   Ht 5\' 10"  (1.778 m)   Wt 96.6 kg   SpO2 100%   BMI 30.56 kg/m  Physical Exam Vitals and nursing note reviewed.  Constitutional:      General: He is not in acute distress.    Appearance: He is well-developed.  HENT:     Head: Normocephalic and atraumatic.  Eyes:     Conjunctiva/sclera:  Conjunctivae normal.  Cardiovascular:     Rate and Rhythm: Normal rate and regular rhythm.     Heart sounds: No murmur heard. Pulmonary:     Effort: Pulmonary effort is normal. No respiratory distress.     Breath sounds: Normal breath sounds. No wheezing or rales.  Chest:     Comments: No reproducible chest tenderness Abdominal:     Palpations: Abdomen is soft.     Tenderness: There is no abdominal tenderness.  Musculoskeletal:        General: No swelling.     Cervical back: Neck supple.  Skin:    General: Skin is warm and dry.     Capillary Refill: Capillary refill takes less than 2 seconds.  Neurological:     Mental Status: He is alert.  Psychiatric:        Mood and Affect: Mood normal.     ED Results / Procedures / Treatments   Labs (all labs ordered are listed, but only abnormal results are displayed) Labs Reviewed  BASIC METABOLIC PANEL  CBC  TROPONIN I (HIGH SENSITIVITY)  TROPONIN I (HIGH SENSITIVITY)    EKG None  Radiology DG Chest 2 View Result Date: 11/07/2023 CLINICAL DATA:  Central chest pressure radiating to the left arm EXAM: CHEST - 2 VIEW COMPARISON:  01/09/2017 FINDINGS:  The heart size and mediastinal contours are within normal limits. Both lungs are clear. The visualized skeletal structures are unremarkable. IMPRESSION: No active cardiopulmonary disease. Electronically Signed   By: Minerva Fester M.D.   On: 11/07/2023 18:45    Procedures Procedures    Medications Ordered in ED Medications - No data to display  ED Course/ Medical Decision Making/ A&P                                 Medical Decision Making Amount and/or Complexity of Data Reviewed Labs: ordered. Radiology: ordered.   This patient presents to the ED with chief complaint(s) of chest pain.  The complaint involves an extensive differential diagnosis and also carries with it a high risk of complications and morbidity.   pertinent past medical history as listed in HPI  The  differential diagnosis includes  ACS, PE, aortic dissection, pneumothorax, pneumonia, esophageal rupture, myocarditis, pericarditis The initial plan is to  Will obtain basic labs, EKG and chest x-ray Additional history obtained:  Records reviewed previous admission documents and Care Everywhere/External Records  Initial Assessment:   Hemodynamically stable, nontoxic appearing patient presenting with chest pain.  No cardiac history or history of blood clots.  No risk factors for blood clots either.  Overall low suspicion for PE.  Does have a strong family history of heart attacks.  No cough, shortness of breath or fever to suggest pneumonia or pneumothorax.  No evidence of esophageal rupture on chest x-ray, no difficulty swallowing.  No evidence of pericarditis on EKG, no recent viral illness to suggest myocarditis.  Independent ECG interpretation:  Sinus rhythm without ischemic changes  Independent labs interpretation:  The following labs were independently interpreted:  CBC, BMP unremarkable, troponins without elevation  Independent visualization and interpretation of imaging: I independently visualized the following imaging with scope of interpretation limited to determining acute life threatening conditions related to emergency care: Chest x-ray, which revealed no cardiopulmonary disease  Treatment and Reassessment: No medications administered during visit  Consultations obtained:   None  Disposition:   Patient will be discharged home.  Encouraged to follow-up with primary care provider within the next 5 days to ensure symptoms are improving. The patient has been appropriately medically screened and/or stabilized in the ED. I have low suspicion for any other emergent medical condition which would require further screening, evaluation or treatment in the ED or require inpatient management. At time of discharge the patient is hemodynamically stable and in no acute distress. I have  discussed work-up results and diagnosis with patient and answered all questions. Patient is agreeable with discharge plan. We discussed strict return precautions for returning to the emergency department and they verbalized understanding.     Social Determinants of Health:   none  This note was dictated with voice recognition software.  Despite best efforts at proofreading, errors may have occurred which can change the documentation meaning.          Final Clinical Impression(s) / ED Diagnoses Final diagnoses:  Atypical chest pain    Rx / DC Orders ED Discharge Orders     None         Fabienne Bruns 11/07/23 2334    Benjiman Core, MD 11/07/23 [phone]

## 2023-11-09 ENCOUNTER — Other Ambulatory Visit: Payer: Self-pay

## 2023-11-10 ENCOUNTER — Other Ambulatory Visit: Payer: Self-pay

## 2023-11-10 NOTE — Progress Notes (Signed)
Patient needs Office visit and updated labs for Descovy prescription. Due to compliance, disenrolling patient from Specialty Services.

## 2024-04-05 ENCOUNTER — Other Ambulatory Visit: Payer: Self-pay

## 2024-04-08 LAB — CBC/D/PLT+ESR WES
Basophils Absolute: 0.1 10*3/uL (ref 0.0–0.2)
Basos: 1 %
EOS (ABSOLUTE): 0.5 10*3/uL — ABNORMAL HIGH (ref 0.0–0.4)
Eos: 7 %
Hematocrit: 46.2 % (ref 37.5–51.0)
Hemoglobin: 15.7 g/dL (ref 13.0–17.7)
Immature Grans (Abs): 0 10*3/uL (ref 0.0–0.1)
Immature Granulocytes: 0 %
Lymphocytes Absolute: 2 10*3/uL (ref 0.7–3.1)
Lymphs: 28 %
MCH: 34.7 pg — ABNORMAL HIGH (ref 26.6–33.0)
MCHC: 34 g/dL (ref 31.5–35.7)
MCV: 102 fL — ABNORMAL HIGH (ref 79–97)
Monocytes Absolute: 0.6 10*3/uL (ref 0.1–0.9)
Monocytes: 8 %
Neutrophils Absolute: 3.9 10*3/uL (ref 1.4–7.0)
Neutrophils: 56 %
Platelets: 202 10*3/uL (ref 150–450)
RBC: 4.53 x10E6/uL (ref 4.14–5.80)
RDW: 13.1 % (ref 11.6–15.4)
Sed Rate: 7 mm/h (ref 0–30)
WBC: 7 10*3/uL (ref 3.4–10.8)

## 2024-04-08 LAB — COMPREHENSIVE METABOLIC PANEL WITH GFR
ALT: 26 IU/L (ref 0–44)
AST: 23 IU/L (ref 0–40)
Albumin: 4.8 g/dL (ref 3.8–4.9)
Alkaline Phosphatase: 48 IU/L (ref 44–121)
BUN/Creatinine Ratio: 10 (ref 9–20)
BUN: 12 mg/dL (ref 6–24)
Bilirubin Total: 0.5 mg/dL (ref 0.0–1.2)
CO2: 23 mmol/L (ref 20–29)
Calcium: 9.8 mg/dL (ref 8.7–10.2)
Chloride: 102 mmol/L (ref 96–106)
Creatinine, Ser: 1.2 mg/dL (ref 0.76–1.27)
Globulin, Total: 2.5 g/dL (ref 1.5–4.5)
Glucose: 101 mg/dL — ABNORMAL HIGH (ref 70–99)
Potassium: 4.4 mmol/L (ref 3.5–5.2)
Sodium: 140 mmol/L (ref 134–144)
Total Protein: 7.3 g/dL (ref 6.0–8.5)
eGFR: 73 mL/min/{1.73_m2} (ref >59)

## 2024-04-08 LAB — G-6-PD, QUANT, BLOOD AND RBC: G-6-PD, Quant: 270 U/10*12{RBCs} (ref 127–427)

## 2024-08-20 ENCOUNTER — Emergency Department (HOSPITAL_COMMUNITY)

## 2024-08-20 ENCOUNTER — Emergency Department (HOSPITAL_BASED_OUTPATIENT_CLINIC_OR_DEPARTMENT_OTHER)
Admission: EM | Admit: 2024-08-20 | Discharge: 2024-08-21 | Disposition: A | Discharge status: Home / Self Care | Attending: Emergency Medicine | Admitting: Emergency Medicine

## 2024-08-20 ENCOUNTER — Emergency Department (HOSPITAL_BASED_OUTPATIENT_CLINIC_OR_DEPARTMENT_OTHER)

## 2024-08-20 ENCOUNTER — Other Ambulatory Visit: Payer: Self-pay

## 2024-08-20 DIAGNOSIS — W2203XA Walked into furniture, initial encounter: Secondary | ICD-10-CM | POA: Insufficient documentation

## 2024-08-20 DIAGNOSIS — S39012A Strain of muscle, fascia and tendon of lower back, initial encounter: Secondary | ICD-10-CM | POA: Diagnosis not present

## 2024-08-20 DIAGNOSIS — R202 Paresthesia of skin: Secondary | ICD-10-CM | POA: Insufficient documentation

## 2024-08-20 DIAGNOSIS — Z8673 Personal history of transient ischemic attack (TIA), and cerebral infarction without residual deficits: Secondary | ICD-10-CM | POA: Insufficient documentation

## 2024-08-20 DIAGNOSIS — Z79899 Other long term (current) drug therapy: Secondary | ICD-10-CM | POA: Diagnosis not present

## 2024-08-20 DIAGNOSIS — I10 Essential (primary) hypertension: Secondary | ICD-10-CM | POA: Diagnosis not present

## 2024-08-20 DIAGNOSIS — S3992XA Unspecified injury of lower back, initial encounter: Secondary | ICD-10-CM | POA: Diagnosis present

## 2024-08-20 LAB — CBC WITH DIFFERENTIAL/PLATELET
Abs Immature Granulocytes: 0.03 K/uL (ref 0.00–0.07)
Basophils Absolute: 0 K/uL (ref 0.0–0.1)
Basophils Relative: 1 %
Eosinophils Absolute: 0.2 K/uL (ref 0.0–0.5)
Eosinophils Relative: 3 %
HCT: 41.6 % (ref 39.0–52.0)
Hemoglobin: 14.6 g/dL (ref 13.0–17.0)
Immature Granulocytes: 0 %
Lymphocytes Relative: 31 %
Lymphs Abs: 2.5 K/uL (ref 0.7–4.0)
MCH: 34.4 pg — ABNORMAL HIGH (ref 26.0–34.0)
MCHC: 35.1 g/dL (ref 30.0–36.0)
MCV: 97.9 fL (ref 80.0–100.0)
Monocytes Absolute: 0.6 K/uL (ref 0.1–1.0)
Monocytes Relative: 8 %
Neutro Abs: 4.7 K/uL (ref 1.7–7.7)
Neutrophils Relative %: 57 %
Platelets: 196 K/uL (ref 150–400)
RBC: 4.25 MIL/uL (ref 4.22–5.81)
RDW: 12.4 % (ref 11.5–15.5)
WBC: 8.1 K/uL (ref 4.0–10.5)
nRBC: 0 % (ref 0.0–0.2)

## 2024-08-20 LAB — COMPREHENSIVE METABOLIC PANEL WITH GFR
ALT: 34 U/L (ref 0–44)
AST: 28 U/L (ref 15–41)
Albumin: 4.8 g/dL (ref 3.5–5.0)
Alkaline Phosphatase: 49 U/L (ref 38–126)
Anion gap: 12 (ref 5–15)
BUN: 11 mg/dL (ref 6–20)
CO2: 24 mmol/L (ref 22–32)
Calcium: 9.6 mg/dL (ref 8.9–10.3)
Chloride: 102 mmol/L (ref 98–111)
Creatinine, Ser: 0.89 mg/dL (ref 0.61–1.24)
GFR, Estimated: >60 mL/min (ref >60)
Glucose, Bld: 99 mg/dL (ref 70–99)
Potassium: 3.8 mmol/L (ref 3.5–5.1)
Sodium: 138 mmol/L (ref 135–145)
Total Bilirubin: 0.7 mg/dL (ref 0.0–1.2)
Total Protein: 7.5 g/dL (ref 6.5–8.1)

## 2024-08-20 LAB — CBG MONITORING, ED: Glucose-Capillary: 90 mg/dL (ref 70–99)

## 2024-08-20 LAB — MAGNESIUM: Magnesium: 2.1 mg/dL (ref 1.7–2.4)

## 2024-08-20 MED ORDER — IOHEXOL 350 MG/ML SOLN
75.0000 mL | Freq: Once | INTRAVENOUS | Status: AC | PRN
  Administered 2024-08-20: 75 mL via INTRAVENOUS

## 2024-08-20 MED ORDER — METHOCARBAMOL 500 MG PO TABS: 500.0000 mg | ORAL_TABLET | Freq: Two times a day (BID) | ORAL | 0 refills | Status: AC

## 2024-08-20 MED ORDER — LORAZEPAM 2 MG/ML IJ SOLN
1.0000 mg | Freq: Once | INTRAMUSCULAR | Status: AC
  Administered 2024-08-20: 1 mg via INTRAVENOUS
  Filled 2024-08-20: qty 1

## 2024-08-20 NOTE — ED Provider Notes (Signed)
 [place] [facility] Provider Note   CSN: [id number] Arrival date & time: 08/20/24  1705     Patient presents with: Numbness and Back Pain   Harry Gonzales is a 53 y.o. male.   Patient is a 53 year old male with a history of hyperlipidemia, hypertension and strong family history of MIs and stroke who is presenting today with multiple complaints.  Patient was in [place]  over the weekend and reports when he was there he was having some intermittent tingling off-and-on of his right and left arm which would be intermittent but would not last for any period of time.  He flew back on Sunday and had been doing okay but today around 1230 he reports starting to feel unusual.  He states he then developed a tingling sensation in his left face, arm and leg.  There is no weakness associated with this he had no difficulty walking.  He was driving to the airport when it started and was still able to make it to the airport.  But then he noticed it was hard to get some words out and at times he felt like his speech was slurred.  He would have an occasional headache but denies any headache now.  This started around 1:00 and he reports now he still does not feel 100% on the left side.  He initially went to urgent care who sent him here for further evaluation.  He is compliant with his blood pressure medications but did report not being compliant with a hypertensive diet while he was in [place] .  He denies ever having anything like this in the past.  The history is provided by the patient.  Back Pain      Prior to Admission medications   Medication Sig Start Date End Date Taking? Authorizing Provider  methocarbamol (ROBAXIN) 500 MG tablet Take 1 tablet (500 mg total) by mouth 2 (two) times daily. 08/20/24  Yes Doretha Folks, MD  acyclovir  (ZOVIRAX ) 400 MG tablet Take 1 tablet (400 mg total) by mouth 2 (two) times daily. 10/22/18   Laurice Maude BROCKS, MD   emtricitabine -tenofovir  AF (DESCOVY ) 200-25 MG tablet Take 1 tablet by mouth daily. 06/06/23   Kuppelweiser, Cassie L, RPH-CPP  fenofibrate  (TRICOR ) 145 MG tablet Take 1 tablet (145 mg total) by mouth daily. 10/22/18 11/21/18  Laurice Maude BROCKS, MD  pravastatin  (PRAVACHOL ) 40 MG tablet Take 2 tablets (80 mg total) by mouth daily. 10/22/18 11/21/18  Laurice Maude BROCKS, MD    Allergies: Codeine    Review of Systems  Musculoskeletal:  Positive for back pain.       He has been having some back pain for the last few weeks after helping someone move.  The pain is across the lower back and worse with lifting the left leg.  Also painful when he bends over.  He denies any new neck pain    Updated Vital Signs BP 136/87 (BP Location: Left Arm)   Pulse 76   Temp 98.2 F (36.8 C) (Oral)   Resp 16   SpO2 99%   Physical Exam Vitals and nursing note reviewed.  Constitutional:      General: He is not in acute distress.    Appearance: He is well-developed.  HENT:     Head: Normocephalic and atraumatic.  Eyes:     General: No visual field deficit.    Conjunctiva/sclera: Conjunctivae normal.     Pupils: Pupils are equal, round, and reactive to light.  Cardiovascular:     Rate and Rhythm: Normal rate and regular rhythm.     Pulses: Normal pulses.     Heart sounds: No murmur heard. Pulmonary:     Effort: Pulmonary effort is normal. No respiratory distress.     Breath sounds: Normal breath sounds. No wheezing or rales.  Abdominal:     General: There is no distension.     Palpations: Abdomen is soft.     Tenderness: There is no abdominal tenderness. There is no guarding or rebound.  Musculoskeletal:        General: No tenderness. Normal range of motion.     Cervical back: Normal range of motion and neck supple.  Skin:    General: Skin is warm and dry.     Findings: No erythema or rash.  Neurological:     Mental Status: He is alert and oriented to person, place, and time.     Cranial Nerves: No  cranial nerve deficit, dysarthria or facial asymmetry.     Sensory: Sensory deficit present.     Motor: Motor function is intact. No weakness or pronator drift.     Coordination: Coordination is intact. Finger-Nose-Finger Test and Heel to [place] Test normal.     Gait: Gait is intact.     Comments: Mild decrease sensation in the left upper and lower extremity compared to the right.  The face is not affected  Psychiatric:        Behavior: Behavior normal.     (all labs ordered are listed, but only abnormal results are displayed) Labs Reviewed  CBC WITH DIFFERENTIAL/PLATELET - Abnormal; Notable for the following components:      Result Value   MCH 34.4 (*)    All other components within normal limits  COMPREHENSIVE METABOLIC PANEL WITH GFR  MAGNESIUM  CBG MONITORING, ED    EKG: EKG Interpretation Date/Time:  Tuesday August 20 2024 17:20:53 EST Ventricular Rate:  94 PR Interval:  135 QRS Duration:  82 QT Interval:  331 QTC Calculation: 414 R Axis:   87  Text Interpretation: Sinus rhythm No significant change since last tracing Confirmed by Doretha Folks ([id number]) on 08/20/2024 8:13:10 PM  Radiology: CT ANGIO HEAD NECK W WO CM Result Date: 08/20/2024 EXAM: CTA Head and Neck with Intravenous Contrast. CT Head without Contrast. CLINICAL HISTORY: Neuro deficit, acute, stroke suspected. TECHNIQUE: Axial CTA images of the head and neck performed with intravenous contrast. MIP reconstructed images were created and reviewed. Axial computed tomography images of the head/brain performed without intravenous contrast. Note: Per PQRS, the description of internal carotid artery percent stenosis, including 0 percent or normal exam, is based on [employer] American Symptomatic Carotid Endarterectomy Trial (NASCET) criteria. Dose reduction technique was used including one or more of the following: automated exposure control, adjustment of mA and kV according to patient size, and/or iterative  reconstruction. CONTRAST: Without and with; 75mL (iohexol  (OMNIPAQUE ) 350 MG/ML injection 75 mL IOHEXOL  350 MG/ML SOLN). COMPARISON: None provided. FINDINGS: Limited CT head. Within this limitation: BRAIN: No acute intraparenchymal hemorrhage. No mass lesion. No CT evidence for acute territorial infarct. No midline shift or extra-axial collection. VENTRICLES: No hydrocephalus. ORBITS: The orbits are unremarkable. SINUSES AND MASTOIDS: The paranasal sinuses and mastoid air cells are clear. COMMON CAROTID ARTERIES: No significant stenosis. No dissection or occlusion. INTERNAL CAROTID ARTERIES: No stenosis by NASCET criteria. No dissection or occlusion. VERTEBRAL ARTERIES: No significant stenosis. No dissection or occlusion. ANTERIOR CEREBRAL ARTERIES: No significant stenosis. No occlusion. No aneurysm.  MIDDLE CEREBRAL ARTERIES: No significant stenosis. No occlusion. No aneurysm. POSTERIOR CEREBRAL ARTERIES: No significant stenosis. No occlusion. No aneurysm. BASILAR ARTERY: No significant stenosis. No occlusion. No aneurysm. SOFT TISSUES: No acute finding. No masses or lymphadenopathy. BONES: No acute osseous abnormality. IMPRESSION: 1. No large vessel occlusion or significant stenosis. 2. No evidence of acute intracranial abnormality on limited CT head. Electronically signed by: Gilmore Molt MD 08/20/2024 09:32 PM EST RP Workstation: [id number]     Procedures   Medications Ordered in the ED  iohexol  (OMNIPAQUE ) 350 MG/ML injection 75 mL (75 mLs Intravenous Contrast Given 08/20/24 2038)                                    Medical Decision Making Amount and/or Complexity of Data Reviewed Labs: ordered. Decision-making details documented in ED Course. Radiology: ordered and independent interpretation performed. Decision-making details documented in ED Course. ECG/medicine tests: ordered and independent interpretation performed. Decision-making details documented in ED Course.  Risk Prescription drug  management.   Pt with multiple medical problems and comorbidities and presenting today with a complaint that caries a high risk for morbidity and mortality.  Here today with the above symptoms.  Concern for possible small stroke or TIA, electrolyte abnormalities, hyperglycemia, anemia, dysrhythmia, hypertensive urgency.  On exam patient has minimal decrease sensation in the left upper and lower extremity but no facial involvement.  No notable speech changes here.  Exam is otherwise intact.  I independently interpreted patient's labs and EKG and CBC, CMP and magnesium are within normal limits.  EKG without acute findings and a normal sinus rhythm.  Patient is hypertensive here but otherwise well-appearing.  Will do a CTA to further evaluate due to concern for stroke.  I have independently visualized and interpreted pt's images today.  CTA today is neg.  Feel that patient will need an MRI to rule out stroke given his complaints.  MRI is not available at this facility and discussed with him going to [facility] to have an MRI done which if normal seems reasonable that patient can be discharged and follow-up with his PCP.      Final diagnoses:  Strain of lumbar region, initial encounter  Paresthesias    ED Discharge Orders          Ordered    methocarbamol (ROBAXIN) 500 MG tablet  2 times daily        08/20/24 2157               Doretha Folks, MD 08/20/24 2157

## 2024-08-20 NOTE — Discharge Instructions (Signed)
 You will go to the [facility] emergency room and they should be aware that you are coming.  The MRI is already ordered.  You can continue to take the ibuprofen  for the back pain but you will need to follow-up with your regular doctor as you may need physical therapy if the pain does not improve.  The muscle relaxer was sent to your pharmacy.

## 2024-08-20 NOTE — ED Notes (Signed)
 ED Provider at bedside.

## 2024-08-20 NOTE — ED Triage Notes (Signed)
 Pt c/o reports transient numbness, started in R hand, switched to L hand, L facial numbness started over the weekend. Now feels like difficulty getting his words out since mid day.   Also c/o lower back pain x1 week after helping someone move furniture.  Took 800 mg motrin  this AM, relieved.  Speech clear at time of triage.

## 2024-08-20 NOTE — ED Notes (Addendum)
 Received call from MRI requesting medication for claustrophobia for pt priort o MRI. Dr Haze aware and order placed.

## 2024-08-21 NOTE — ED Provider Notes (Signed)
 Patient transferred for MRI of brain to evaluate for possible stroke secondary to transient numbness in his extremities.  MRI has been performed and is negative.  He reports that he is at his baseline.  Does not require any further ER workup.  Can refer to neurology outpatient.   Haze Lonni PARAS, MD 08/21/24 [phone]

## 2024-09-18 ENCOUNTER — Encounter: Payer: Self-pay | Admitting: Neurology

## 2024-11-06 ENCOUNTER — Ambulatory Visit: Admitting: Neurology

## 2025-01-06 ENCOUNTER — Ambulatory Visit: Admitting: Neurology
